# Patient Record
Sex: Female | Born: 1975
Health system: Southern US, Community
[De-identification: ages and names within clinical notes are randomized; demographics above are authoritative.]

## PROBLEM LIST (undated history)

## (undated) DIAGNOSIS — O139 Gestational [pregnancy-induced] hypertension without significant proteinuria, unspecified trimester: Secondary | ICD-10-CM

## (undated) DIAGNOSIS — L570 Actinic keratosis: Secondary | ICD-10-CM

## (undated) DIAGNOSIS — K900 Celiac disease: Secondary | ICD-10-CM

## (undated) HISTORY — DX: Celiac disease: K90.0

## (undated) HISTORY — DX: Actinic keratosis: L57.0

## (undated) HISTORY — PX: HERNIA REPAIR: SHX51

## (undated) HISTORY — DX: Gestational (pregnancy-induced) hypertension without significant proteinuria, unspecified trimester: O13.9

---

## 2009-03-04 HISTORY — PX: REFRACTIVE SURGERY: SHX103

## 2009-03-04 HISTORY — PX: HERNIA REPAIR: SHX51

## 2016-10-22 ENCOUNTER — Encounter: Payer: Self-pay | Admitting: Maternal Newborn

## 2016-10-22 ENCOUNTER — Ambulatory Visit (INDEPENDENT_AMBULATORY_CARE_PROVIDER_SITE_OTHER): Payer: Managed Care, Other (non HMO) | Admitting: Maternal Newborn

## 2016-10-22 VITALS — BP 100/60 | Ht 66.0 in | Wt 152.0 lb

## 2016-10-22 DIAGNOSIS — Z3A09 9 weeks gestation of pregnancy: Secondary | ICD-10-CM

## 2016-10-22 DIAGNOSIS — O0991 Supervision of high risk pregnancy, unspecified, first trimester: Secondary | ICD-10-CM

## 2016-10-22 DIAGNOSIS — K900 Celiac disease: Secondary | ICD-10-CM

## 2016-10-22 DIAGNOSIS — O09529 Supervision of elderly multigravida, unspecified trimester: Secondary | ICD-10-CM | POA: Insufficient documentation

## 2016-10-22 NOTE — Progress Notes (Signed)
10/22/2016    Chief Complaint: Initial prenatal visit  Transfer of Care Patient: yes (moved from [place])  History of Present Illness: [name] is a 41 y.o. G3P0020 at 63 w 5 d gestation based on LMP.  Patient's last menstrual period was 08/15/2016 (exact date). Estimated Date of Delivery: 05/22/2017, with the above CC.   Her periods were: regular periods every 28 days She was using no method when she conceived.  She has Positive signs or symptoms of nausea/vomiting of pregnancy. She has Negative signs or symptoms of miscarriage or preterm labor She identifies Negative Zika risk factors for her and her partner On any different medications around the time she conceived/early pregnancy: No  History of varicella: Yes   ROS: A 12-point review of systems was performed and negative, except as stated in the above HPI.  OBGYN History: As per HPI. OB History  Gravida Para Term Preterm AB Living  3       2    SAB TAB Ectopic Multiple Live Births  1   1        # Outcome Date GA Lbr Len/2nd Weight Sex Delivery Anes PTL Lv  3 Current           2 Ectopic           1 SAB               Any issues with any prior pregnancies: yes. G1 was a SAB and G2 was ectopic in January 2018. Any prior children are healthy, doing well, without any problems or issues: not applicable History of pap smears: Yes. Last pap smear 07/01/16. Abnormal: no   History of STIs: No   Past Medical History: Past Medical History:  Diagnosis Date  . Celiac disease     Past Surgical History: Past Surgical History:  Procedure Laterality Date  . HERNIA REPAIR      Family History:  Family History  Problem Relation Age of Onset  . Cancer Maternal Grandfather   . Cancer Paternal Grandmother    She denies any female cancers, bleeding or blood clotting disorders.  She denies any history of mental retardation, birth defects or genetic disorders in her or the FOB's history  Social History:  Social History    Social History  . Marital status: Married    Spouse name: N/A  . Number of children: N/A  . Years of education: N/A   Occupational History  . Not on file.   Social History Main Topics  . Smoking status: Never Smoker  . Smokeless tobacco: Never Used  . Alcohol use No  . Drug use: No  . Sexual activity: Yes   Other Topics Concern  . Not on file   Social History Narrative  . No narrative on file   Any cats in the household: no   Allergy: Allergies  Allergen Reactions  . Gluten Meal     Current Outpatient Medications: No current outpatient prescriptions on file.   Physical Exam:   BP 100/60   Ht 5\' 6"  (1.676 m)   Wt 152 lb (68.9 kg)   LMP 08/15/2016 (Exact Date)   BMI 24.53 kg/m  Body mass index is 24.53 kg/m. Constitutional: Well nourished, well developed female in no acute distress.  Neck:  Supple, normal appearance, and no thyromegaly  Cardiovascular: S1, S2 normal, no murmur, rub or gallop, regular rate and rhythm Respiratory:  Clear to auscultation bilateral. Normal respiratory effort Abdomen: positive bowel sounds and no masses,  hernias; diffusely non tender to palpation, non distended Breasts: patient declines to have breast exam Neuro/Psych:  Normal mood and affect.  Skin:  Warm and dry.  Lymphatic:  No inguinal lymphadenopathy.   Pelvic exam: is not limited by body habitus Vagina: within normal limits and with no blood in the vault Cervix: without discharge, closed/long/high,  Uterus: enlarged consistent with pregnancy Adnexa:  normal adnexa  Assessment: [name] is a 41 y.o. [id number] with an Estimated Date of Delivery: 05/22/2017, presenting for prenatal care.  [facility] Prenatal Labs   Dating  LMP [ ]  U/S ordered Blood type:     Genetic Screen 1 Screen:    AFP:     Quad:     NIPS:    Antibody:   Anatomic [place]  Rubella:   Varicella:    GTT Early:               Third trimester:  RPR:     Rhogam  HBsAg:     TDaP vaccine                        Flu Shot: HIV:     Baby Food                                GBS:   Contraception  Pap: 07/01/16, normal per patient  CBB     CS/VBAC    Support Person          Plan:   1) Avoid alcoholic beverages. 2) Patient encouraged not to smoke.  3) Discontinue the use of all non-medicinal drugs and chemicals.  4) Take prenatal vitamins daily.  5) Seatbelt use advised 6) Nutrition, food safety (fish, cheese advisories, and high nitrite foods) and exercise discussed. 7) Hospital and practice style delivering at [facility] discussed  8) Patient is asked about travel to areas at risk for the [place] virus, and counseled to avoid travel and exposure to mosquitoes or sexual partners who may have themselves been exposed to the virus. Testing is discussed, and will be ordered as appropriate.  9) Childbirth classes at [facility] advised 10) Genetic Screening, such as with 1st Trimester Screening, cell free fetal DNA, AFP testing, and Ultrasound, as well as with amniocentesis and CVS as appropriate, is discussed with patient. She plans to have genetic testing this pregnancy.  Patient had a prenatal visit in [place] before moving, with labs and an early ultrasound around 5 weeks. Authorization was given for records to be sent. She desires to wait for lab testing until [place] to see if her records arrive.  Ultrasound ordered for dates and viability, pt to schedule for tomorrow or as soon as she can return.  Samples given of [place] for evening nausea, and pt advised to call for script if desired.  Problem list reviewed and updated.  Avel Sensor, CNM [facility], [facility] 10/22/2016  4:26 PM

## 2016-10-25 ENCOUNTER — Other Ambulatory Visit: Payer: Managed Care, Other (non HMO)

## 2016-10-25 ENCOUNTER — Encounter: Payer: Managed Care, Other (non HMO) | Admitting: Maternal Newborn

## 2016-10-25 ENCOUNTER — Ambulatory Visit (INDEPENDENT_AMBULATORY_CARE_PROVIDER_SITE_OTHER): Payer: Managed Care, Other (non HMO)

## 2016-10-25 ENCOUNTER — Ambulatory Visit (INDEPENDENT_AMBULATORY_CARE_PROVIDER_SITE_OTHER): Payer: Managed Care, Other (non HMO) | Admitting: Maternal Newborn

## 2016-10-25 VITALS — BP 100/80 | Wt 154.0 lb

## 2016-10-25 DIAGNOSIS — O0991 Supervision of high risk pregnancy, unspecified, first trimester: Secondary | ICD-10-CM

## 2016-10-25 DIAGNOSIS — O09529 Supervision of elderly multigravida, unspecified trimester: Secondary | ICD-10-CM

## 2016-10-25 DIAGNOSIS — Z362 Encounter for other antenatal screening follow-up: Secondary | ICD-10-CM | POA: Diagnosis not present

## 2016-10-25 DIAGNOSIS — K900 Celiac disease: Secondary | ICD-10-CM

## 2016-10-25 NOTE — Progress Notes (Signed)
    Routine Prenatal Care Visit  Subjective  Kristine Arroyo is a 41 y.o. G3P0020 at [redacted]w[redacted]d being seen today for ongoing prenatal care.  She is currently monitored for the following issues for this high-risk pregnancy and has Supervision of high risk pregnancy, antepartum, first trimester; Celiac disease; and Antepartum multigravida of advanced maternal age on her problem list.  ----------------------------------------------------------------------------------- Patient reports no complaints.    Vag. Bleeding: None.   Denies leaking of fluid.  ----------------------------------------------------------------------------------- The following portions of the patient's history were reviewed and updated as appropriate: allergies, current medications, past family history, past medical history, past social history, past surgical history and problem list. Problem list updated.   Objective  Blood pressure 100/80, weight 154 lb (69.9 kg), last menstrual period 08/15/2016. Pregravid weight 150 lb (68 kg) Total Weight Gain 4 lb (1.814 kg) Urinalysis: Urine Protein: Negative Urine Glucose: Negative  Fetal Status:  Fetal Heart Rate (bpm): 171 (seen on [place])         General:  Alert, oriented and cooperative. Patient is in no acute distress.  Skin: Skin is warm and dry. No rash noted.   Cardiovascular: Normal heart rate noted  Respiratory: Normal respiratory effort, no problems with respiration noted  Abdomen: Soft, gravid, appropriate for gestational age. Pain/Pressure: Absent     Pelvic:  Cervical exam deferred        Extremities: Normal range of motion.     ental Status: Normal mood and affect. Normal behavior. Normal judgment and thought content.   Ultrasound today shows singleton IUP with cardiac activity.  Assessment   41 y.o. G3P0020 at [redacted]w[redacted]d by  05/22/2017, by Last Menstrual Period presenting for routine prenatal visit  Plan   pregnancy 3 Problems (from 08/15/16 to present)    Problem Noted  Resolved   Supervision of high risk pregnancy, antepartum, first trimester 10/22/2016 by Rexene Agent, CNM No   Overview Signed 10/22/2016  4:57 PM by Rexene Agent, [place] Prenatal Labs  Dating LMP=10 w [place] Blood type:     Genetic Screen 1 Screen:    AFP:     Quad:     NIPS: Antibody:   Anatomic [place]  Rubella:   Varicella:    GTT Early:               Third trimester:  RPR:     Rhogam  HBsAg:     TDaP vaccine                       Flu Shot: HIV:     Baby Food                                GBS:   Contraception  Pap:  CBB     CS/VBAC    Support Person              Antepartum multigravida of advanced maternal age 90/21/2018 by Rexene Agent, CNM No      MaterniT 21 ordered as patient desires genetic testing.  Preterm labor symptoms and general obstetric precautions including but not limited to vaginal bleeding, contractions, leaking of fluid and fetal movement were reviewed in detail with the patient. Please refer to After Visit Summary for other counseling recommendations.   Return in about 4 weeks (around 11/22/2016) for ROB.   Avel Sensor, CNM

## 2016-10-27 ENCOUNTER — Emergency Department: Payer: 59

## 2016-10-27 ENCOUNTER — Emergency Department
Admission: EM | Admit: 2016-10-27 | Discharge: 2016-10-27 | Disposition: A | Discharge status: Home / Self Care | Payer: 59 | Attending: Emergency Medicine | Admitting: Emergency Medicine

## 2016-10-27 DIAGNOSIS — N83201 Unspecified ovarian cyst, right side: Secondary | ICD-10-CM

## 2016-10-27 DIAGNOSIS — R109 Unspecified abdominal pain: Secondary | ICD-10-CM

## 2016-10-27 DIAGNOSIS — Z3A11 11 weeks gestation of pregnancy: Secondary | ICD-10-CM | POA: Diagnosis not present

## 2016-10-27 DIAGNOSIS — K900 Celiac disease: Secondary | ICD-10-CM | POA: Insufficient documentation

## 2016-10-27 DIAGNOSIS — O0991 Supervision of high risk pregnancy, unspecified, first trimester: Secondary | ICD-10-CM | POA: Insufficient documentation

## 2016-10-27 DIAGNOSIS — O26891 Other specified pregnancy related conditions, first trimester: Secondary | ICD-10-CM

## 2016-10-27 DIAGNOSIS — O3481 Maternal care for other abnormalities of pelvic organs, first trimester: Secondary | ICD-10-CM | POA: Insufficient documentation

## 2016-10-27 DIAGNOSIS — R1031 Right lower quadrant pain: Secondary | ICD-10-CM | POA: Diagnosis present

## 2016-10-27 LAB — URINALYSIS, COMPLETE (UACMP) WITH MICROSCOPIC
BACTERIA UA: NONE SEEN
Bilirubin Urine: NEGATIVE
Glucose, UA: NEGATIVE mg/dL
Hgb urine dipstick: NEGATIVE
KETONES UR: 20 mg/dL — AB
Leukocytes, UA: NEGATIVE
NITRITE: NEGATIVE
PROTEIN: NEGATIVE mg/dL
Specific Gravity, Urine: 1.019 (ref 1.005–1.030)
pH: 7 (ref 5.0–8.0)

## 2016-10-27 LAB — CBC
HEMATOCRIT: 39.3 % (ref 35.0–47.0)
Hemoglobin: 13.8 g/dL (ref 12.0–16.0)
MCH: 31.2 pg (ref 26.0–34.0)
MCHC: 35.2 g/dL (ref 32.0–36.0)
MCV: 88.6 fL (ref 80.0–100.0)
Platelets: 174 10*3/uL (ref 150–440)
RBC: 4.43 MIL/uL (ref 3.80–5.20)
RDW: 12.5 % (ref 11.5–14.5)
WBC: 8.4 10*3/uL (ref 3.6–11.0)

## 2016-10-27 LAB — WET PREP, GENITAL
Clue Cells Wet Prep HPF POC: NONE SEEN
SPERM: NONE SEEN
TRICH WET PREP: NONE SEEN
Yeast Wet Prep HPF POC: NONE SEEN

## 2016-10-27 LAB — CHLAMYDIA/NGC RT PCR (ARMC ONLY)
CHLAMYDIA TR: NOT DETECTED
N GONORRHOEAE: NOT DETECTED

## 2016-10-27 LAB — COMPREHENSIVE METABOLIC PANEL
ALBUMIN: 4.6 g/dL (ref 3.5–5.0)
ALK PHOS: 48 U/L (ref 38–126)
ALT: 11 U/L — ABNORMAL LOW (ref 14–54)
AST: 22 U/L (ref 15–41)
Anion gap: 9 (ref 5–15)
BILIRUBIN TOTAL: 0.6 mg/dL (ref 0.3–1.2)
BUN: 7 mg/dL (ref 6–20)
CO2: 20 mmol/L — AB (ref 22–32)
Calcium: 9.4 mg/dL (ref 8.9–10.3)
Chloride: 106 mmol/L (ref 101–111)
Creatinine, Ser: 0.54 mg/dL (ref 0.44–1.00)
GFR calc Af Amer: >60 mL/min (ref >60)
GFR calc non Af Amer: >60 mL/min (ref >60)
GLUCOSE: 86 mg/dL (ref 65–99)
POTASSIUM: 4.1 mmol/L (ref 3.5–5.1)
Sodium: 135 mmol/L (ref 135–145)
TOTAL PROTEIN: 7.9 g/dL (ref 6.5–8.1)

## 2016-10-27 LAB — LIPASE, BLOOD: Lipase: 24 U/L (ref 11–51)

## 2016-10-27 LAB — HCG, QUANTITATIVE, PREGNANCY: HCG, BETA CHAIN, QUANT, S: 58107 m[IU]/mL — AB (ref <5)

## 2016-10-27 MED ORDER — HYDROCODONE-ACETAMINOPHEN 5-325 MG PO TABS
1.0000 | ORAL_TABLET | Freq: Once | ORAL | Status: AC
  Administered 2016-10-27: 1 via ORAL
  Filled 2016-10-27: qty 1

## 2016-10-27 MED ORDER — SODIUM CHLORIDE 0.9 % IV BOLUS (SEPSIS)
1000.0000 mL | Freq: Once | INTRAVENOUS | Status: AC
  Administered 2016-10-27: 1000 mL via INTRAVENOUS

## 2016-10-27 MED ORDER — MORPHINE SULFATE (PF) 4 MG/ML IV SOLN
4.0000 mg | Freq: Once | INTRAVENOUS | Status: AC
  Administered 2016-10-27: 4 mg via INTRAVENOUS
  Filled 2016-10-27: qty 1

## 2016-10-27 MED ORDER — HYDROCODONE-ACETAMINOPHEN 5-325 MG PO TABS: 1.0000 | ORAL_TABLET | Freq: Four times a day (QID) | ORAL | 0 refills | Status: DC | PRN

## 2016-10-27 NOTE — ED Notes (Signed)
Pt left for ultrasound.

## 2016-10-27 NOTE — ED Notes (Signed)
Patient was given ginger ale for a PO challenge. Patient declined crackers.

## 2016-10-27 NOTE — ED Triage Notes (Signed)
Pt states she started having right lower abdominal pain this morning. Pt is [redacted] weeks pregnant and had [place] on Friday. Pt states the pain radiates towards her back.  Denies any vaginal bleeding and states she is having vaginal discharge that is white and creamy.

## 2016-10-27 NOTE — Discharge Instructions (Signed)
If you have increased pain, fever, vomiting, or you feel worse in any way please return to the emergency department. Please see your OB tomorrow without fail.

## 2016-10-27 NOTE — ED Notes (Signed)
Pt actively vomiting in triage 200cc

## 2016-10-27 NOTE — ED Notes (Signed)
Pelvic exam performed. Patient tolerated procedure well.

## 2016-10-27 NOTE — ED Provider Notes (Addendum)
[facility] Emergency Department Provider Note  ____________________________________________   I have reviewed the triage vital signs and the nursing notes.   HISTORY  Chief Complaint Abdominal Pain    HPI Kristine Arroyo is a 41 y.o. female  Presents today c/o sudden onset r sided lower abd pain. Pt is [redacted] weeks pregnant. She is g3P0. She had a tab at 18 and ectopic treated with mtx late last year [place]. She is not taking profertility agents. She had a remote hernia repair years ago on the right, otw no surgical hx.  Has a known iup from US performed at [facility] 2 days ago. Not known if she has a cyst. No hx of stones.  pain is sharp and in the r lower abd. Sudden onset a few hours pt arrival while pt was at rest. Nothing makes it better nothing makes it worse. No fever. Had some nausea. Is afraid of needles and vomited x 1 when the needle was placed but did feel that was because of anxiety about the iv.  No change in stooling . No fever.  Nothing makes it better or worse. Seems to radiate towards the right flank. No dysuria or frequency. Has normal vaginal d/c withno change.  Has one partner.      Past Medical History:  Diagnosis Date  . Celiac disease     Patient Active Problem List   Diagnosis Date Noted  . Supervision of high risk pregnancy, antepartum, first trimester 10/22/2016  . Celiac disease 10/22/2016  . Antepartum multigravida of advanced maternal age 35/21/2018    Past Surgical History:  Procedure Laterality Date  . HERNIA REPAIR      Prior to Admission medications   Not on File    Allergies Gluten meal  Family History  Problem Relation Age of Onset  . Cancer Maternal Grandfather   . Cancer Paternal Grandmother     Social History Social History  Substance Use Topics  . Smoking status: Never Smoker  . Smokeless tobacco: Never Used  . Alcohol use No    Review of Systems Constitutional: No fever/chills Eyes: No  visual changes. ENT: No sore throat. No stiff neck no neck pain Cardiovascular: Denies chest pain. Respiratory: Denies shortness of breath. Gastrointestinal:   + vomiting.  No diarrhea.  No constipation. Genitourinary: Negative for dysuria. Musculoskeletal: Negative lower extremity swelling Skin: Negative for rash. Neurological: Negative for severe headaches, focal weakness or numbness.   ____________________________________________   PHYSICAL EXAM:  VITAL SIGNS: ED Triage Vitals  Enc Vitals Group     BP 10/27/16 1045 128/73     Pulse Rate 10/27/16 1045 74     Resp 10/27/16 1045 20     Temp 10/27/16 1045 98.3 F (36.8 C)     Temp Source 10/27/16 1045 Oral     SpO2 10/27/16 1045 100 %     Weight 10/27/16 1045 154 lb (69.9 kg)     Height 10/27/16 1045 5\' 6"  (1.676 m)     Head Circumference --      Peak Flow --      Pain Score 10/27/16 1044 8     Pain Loc --      Pain Edu? --      Excl. in [place]? --     Constitutional: Alert and oriented. Well appearing and in no acute distress. Eyes: Conjunctivae are normal Head: Atraumatic HEENT: No congestion/rhinnorhea. Mucous membranes are moist.  Oropharynx non-erythematous Neck:   Nontender with no  meningismus, no masses, no stridor Cardiovascular: Normal rate, regular rhythm. Grossly normal heart sounds.  Good peripheral circulation. Respiratory: Normal respiratory effort.  No retractions. Lungs CTAB. Abdominal: Soft and + minimal tenderness to palpation in the medial R abd. No distention. No guarding no rebound Back:  There is no focal tenderness or step off.  there is no midline tenderness there are no lesions noted. there is no CVA tenderness GU: deferred for [place] Musculoskeletal: No lower extremity tenderness, no upper extremity tenderness. No joint effusions, no DVT signs strong distal pulses no edema Neurologic:  Normal speech and language. No gross focal neurologic deficits are appreciated.  Skin:  Skin is warm, dry and intact.  No rash noted. Psychiatric: Mood and affect are normal. Speech and behavior are normal.  ____________________________________________   LABS (all labs ordered are listed, but only abnormal results are displayed)  Labs Reviewed  COMPREHENSIVE METABOLIC PANEL - Abnormal; Notable for the following:       Result Value   CO2 20 (*)    ALT 11 (*)    All other components within normal limits  URINALYSIS, COMPLETE (UACMP) WITH MICROSCOPIC - Abnormal; Notable for the following:    Color, Urine YELLOW (*)    APPearance CLOUDY (*)    Ketones, ur 20 (*)    Squamous Epithelial / LPF 0-5 (*)    All other components within normal limits  LIPASE, BLOOD  CBC  HCG, QUANTITATIVE, PREGNANCY   ____________________________________________  EKG  I personally interpreted any EKGs ordered by me or triage ____________________________________________  RADIOLOGY  I reviewed any imaging ordered by me or triage that were performed during my shift and, if possible, patient and/or family made aware of any abnormal findings. ____________________________________________   PROCEDURES  Procedure(s) performed: None  Procedures  Critical Care performed: None  ____________________________________________   INITIAL IMPRESSION / ASSESSMENT AND PLAN / ED COURSE  Pertinent labs & imaging results that were available during my care of the patient were reviewed by me and considered in my medical decision making (see chart for details).  Pt here with benign abd and sudden onset r adnexal area pain in the context of pregnancy. No blood in her urine. Hd stable  Patient in no acute distress, has ongoing pain which is reproducible,differential does include ovarian cyst versus torsion versus kidney stone,  ----------------------------------------- 1:47 PM on 10/27/2016 -----------------------------------------  Pain well controlled at this time,  Pelvic exam: Female nurse chaperone present, no external  lesions noted, physiologic vaginal discharge noted with no purulent discharge, no cervical motion tenderness, + slight adnexal tenderness or mass, there is no significant uterine tenderness or mass. No vaginal bleeding  ----------------------------------------- 1:48 PM on 10/27/2016 -----------------------------------------  W/u very reassuring here. Again, low suspicion for appy. No peritoneal signs, no fever, sudden onset pain.  Has + corpeus luteal cyst which ispossibly because of her pain but no evidence of torsion. I discussed with Dr. Kenton Kingfisher ofOB/GYN, who feel that the patient likelydoes not have appendicitis either and does not think MRI is indicated. He thinks close outpatient follow-up in a few days with pain control for likely cystic or round ligament pain would be a reasonable plan. Nothing to suggest ectopic pregnancy, nothing to suggest torsion for heterotropic preg. Nothing  to suggest appendicitis clinically, we will see if she can tolerate by mouth, patien very reassured findings. Has minimal discomfort at this time. She would like a vicodin in case the pain comes back.  Will try po.   Return precautions  and f/u given and understood. Pt does have 'celiac dz' and cannot have crackers so we will try gingerale to start.   ----------------------------------------- 2:39 PM on 10/27/2016 -----------------------------------------  tol po here only complaint is hunger will d/c.    ____________________________________________   FINAL CLINICAL IMPRESSION(S) / ED DIAGNOSES  Final diagnoses:  Abdominal pain      This chart was dictated using voice recognition software.  Despite best efforts to proofread,  errors can occur which can change meaning.      Schuyler Amor, MD 10/27/16 1404    Schuyler Amor, MD 10/27/16 1408    Schuyler Amor, MD 10/27/16 [phone]

## 2016-10-28 ENCOUNTER — Encounter: Payer: Self-pay | Admitting: Advanced Practice Midwife

## 2016-10-28 ENCOUNTER — Telehealth: Payer: Self-pay | Admitting: Obstetrics & Gynecology

## 2016-10-28 NOTE — Telephone Encounter (Signed)
Pt is calling needing to be seen. Pt states she is [redacted] weeks Pregnant and having abdominal pain. Pt states she went to [facility] ER yesterday. Please review report and advise work In.,

## 2016-10-28 NOTE — Telephone Encounter (Signed)
Pt given reassuring findings in ER yesterday, if she has started bleeding or severe increase is pain from yesterday please check with MD for appt. Otherwise can wait until next scheduled appt.

## 2016-10-30 ENCOUNTER — Encounter: Payer: Self-pay | Admitting: Maternal Newborn

## 2016-10-30 LAB — MATERNIT 21 PLUS CORE, BLOOD
CHROMOSOME 13: NEGATIVE
CHROMOSOME 21: NEGATIVE
Chromosome 18: NEGATIVE
Y CHROMOSOME: NOT DETECTED

## 2016-10-30 NOTE — Telephone Encounter (Signed)
Called and left voicemail for patient to call back to schedule °

## 2016-11-01 ENCOUNTER — Encounter: Payer: Self-pay | Admitting: Maternal Newborn

## 2016-11-19 ENCOUNTER — Encounter: Payer: Self-pay | Admitting: Maternal Newborn

## 2016-11-22 ENCOUNTER — Encounter: Payer: Managed Care, Other (non HMO) | Admitting: Maternal Newborn

## 2016-11-29 ENCOUNTER — Ambulatory Visit (INDEPENDENT_AMBULATORY_CARE_PROVIDER_SITE_OTHER): Payer: 59 | Admitting: Maternal Newborn

## 2016-11-29 VITALS — BP 120/80 | Wt 151.0 lb

## 2016-11-29 DIAGNOSIS — Z3A15 15 weeks gestation of pregnancy: Secondary | ICD-10-CM

## 2016-11-29 DIAGNOSIS — O09522 Supervision of elderly multigravida, second trimester: Secondary | ICD-10-CM

## 2016-11-29 DIAGNOSIS — O0991 Supervision of high risk pregnancy, unspecified, first trimester: Secondary | ICD-10-CM

## 2016-11-29 NOTE — Progress Notes (Signed)
    Routine Prenatal Care Visit  Subjective  Kristine Arroyo is a 41 y.o. G3P0020 at [redacted]w[redacted]d being seen today for ongoing prenatal care.  She is currently monitored for the following issues for this high-risk pregnancy and has Supervision of high risk pregnancy, antepartum, first trimester; Celiac disease; and Antepartum multigravida of advanced maternal age on her problem list.  ----------------------------------------------------------------------------------- Patient reports no complaints.    .  .   . Denies leaking of fluid.  ----------------------------------------------------------------------------------- The following portions of the patient's history were reviewed and updated as appropriate: allergies, current medications, past family history, past medical history, past social history, past surgical history and problem list. Problem list updated.   Objective  Blood pressure 120/80, weight 151 lb (68.5 kg), last menstrual period 08/15/2016. Pregravid weight 150 lb (68 kg) Total Weight Gain 1 lb (0.454 kg) Urinalysis: Urine Protein: Negative Urine Glucose: Negative  Fetal Status:           General:  Alert, oriented and cooperative. Patient is in no acute distress.  Skin: Skin is warm and dry. No rash noted.   Cardiovascular: Normal heart rate noted  Respiratory: Normal respiratory effort, no problems with respiration noted  Abdomen: Soft, gravid, appropriate for gestational age.       Pelvic:  Cervical exam deferred        Extremities: Normal range of motion.     Mental Status: Normal mood and affect. Normal behavior. Normal judgment and thought content.     Assessment   41 y.o. G3P0020 at [redacted]w[redacted]d by  05/22/2017, by Last Menstrual Period presenting for routine prenatal visit  Plan   pregnancy 3 Problems (from 08/15/16 to present)    Problem Noted Resolved   Supervision of high risk pregnancy, antepartum, first trimester 10/22/2016 by Rexene Agent, CNM No   Overview Signed  10/22/2016  4:57 PM by Rexene Agent, [place] Prenatal Labs  Dating LMP Blood type: O Pos    Genetic Screen Materniti21 - Neg, It's a girl! Antibody:   Anatomic [place]  Rubella: Immune  Varicella:    GTT Early:               Third trimester:  RPR:  Non-reactive  Rhogam  HBsAg: Negative   TDaP vaccine                       Flu Shot: HIV:  Non-reactive  Baby Food                                GBS:   Contraception  Pap:  CBB     CS/VBAC    Support Person              Antepartum multigravida of advanced maternal age 53/21/2018 by Rexene Agent, CNM No    Nausea has greatly improved; able to eat more often and larger variety of foods. Discussed antenatal testing recommended for pregnancy at age 20+ Anatomy US next visit.  Preterm labor symptoms and general obstetric precautions including but not limited to vaginal bleeding, contractions, leaking of fluid and fetal movement were reviewed in detail with the patient.  Return in about 4 weeks (around 12/27/2016) for [place] following [place].  Avel Sensor, CNM 11/29/2016  4:49 PM

## 2016-12-16 ENCOUNTER — Encounter: Payer: Self-pay | Admitting: Maternal Newborn

## 2016-12-17 ENCOUNTER — Other Ambulatory Visit: Payer: Self-pay | Admitting: Maternal Newborn

## 2016-12-17 DIAGNOSIS — M545 Low back pain, unspecified: Secondary | ICD-10-CM | POA: Insufficient documentation

## 2016-12-17 MED ORDER — CYCLOBENZAPRINE HCL 10 MG PO TABS: 10.0000 mg | ORAL_TABLET | Freq: Three times a day (TID) | ORAL | 2 refills | Status: DC | PRN

## 2016-12-17 NOTE — Progress Notes (Signed)
Sent Rx for Flexeril for worsening lower back pain and muscle spasms.  Avel Sensor, CNM 12/17/2016  5:15 PM

## 2016-12-27 ENCOUNTER — Ambulatory Visit (INDEPENDENT_AMBULATORY_CARE_PROVIDER_SITE_OTHER): Payer: 59 | Admitting: Obstetrics & Gynecology

## 2016-12-27 ENCOUNTER — Ambulatory Visit (INDEPENDENT_AMBULATORY_CARE_PROVIDER_SITE_OTHER): Payer: 59

## 2016-12-27 VITALS — BP 120/70 | Wt 154.0 lb

## 2016-12-27 DIAGNOSIS — O09522 Supervision of elderly multigravida, second trimester: Secondary | ICD-10-CM

## 2016-12-27 DIAGNOSIS — O09529 Supervision of elderly multigravida, unspecified trimester: Secondary | ICD-10-CM

## 2016-12-27 DIAGNOSIS — Z3A19 19 weeks gestation of pregnancy: Secondary | ICD-10-CM

## 2016-12-27 DIAGNOSIS — O0991 Supervision of high risk pregnancy, unspecified, first trimester: Secondary | ICD-10-CM

## 2016-12-27 NOTE — Patient Instructions (Signed)

## 2016-12-27 NOTE — Progress Notes (Signed)
Review of ULTRASOUND.    I have personally reviewed images and report of recent ultrasound done at [facility].    Plan of management to be discussed with patient.  PNV, Breast feeding plans.  [name], MD, [facility], [facility] 12/27/2016  4:54 PM

## 2017-01-01 ENCOUNTER — Encounter: Payer: Self-pay | Admitting: Advanced Practice Midwife

## 2017-01-03 ENCOUNTER — Other Ambulatory Visit: Payer: Self-pay | Admitting: Maternal Newborn

## 2017-01-03 DIAGNOSIS — O09522 Supervision of elderly multigravida, second trimester: Secondary | ICD-10-CM

## 2017-01-27 ENCOUNTER — Ambulatory Visit (INDEPENDENT_AMBULATORY_CARE_PROVIDER_SITE_OTHER): Payer: 59

## 2017-01-27 ENCOUNTER — Ambulatory Visit (INDEPENDENT_AMBULATORY_CARE_PROVIDER_SITE_OTHER): Payer: 59 | Admitting: Advanced Practice Midwife

## 2017-01-27 ENCOUNTER — Encounter: Payer: Self-pay | Admitting: Advanced Practice Midwife

## 2017-01-27 VITALS — BP 108/60 | Wt 158.0 lb

## 2017-01-27 DIAGNOSIS — O0991 Supervision of high risk pregnancy, unspecified, first trimester: Secondary | ICD-10-CM

## 2017-01-27 DIAGNOSIS — O09522 Supervision of elderly multigravida, second trimester: Secondary | ICD-10-CM | POA: Diagnosis not present

## 2017-01-27 DIAGNOSIS — Z3A23 23 weeks gestation of pregnancy: Secondary | ICD-10-CM

## 2017-01-27 DIAGNOSIS — Z362 Encounter for other antenatal screening follow-up: Secondary | ICD-10-CM | POA: Diagnosis not present

## 2017-01-27 DIAGNOSIS — Z131 Encounter for screening for diabetes mellitus: Secondary | ICD-10-CM

## 2017-01-27 DIAGNOSIS — Z130 Encounter for screening for diseases of the blood and blood-forming organs and certain disorders involving the immune mechanism: Secondary | ICD-10-CM

## 2017-01-27 DIAGNOSIS — O09529 Supervision of elderly multigravida, unspecified trimester: Secondary | ICD-10-CM

## 2017-01-27 DIAGNOSIS — Z113 Encounter for screening for infections with a predominantly sexual mode of transmission: Secondary | ICD-10-CM

## 2017-01-27 NOTE — Progress Notes (Signed)
  Routine Prenatal Care Visit  Subjective  Kristine Arroyo is a 41 y.o. G3P0020 at [redacted]w[redacted]d being seen today for ongoing prenatal care.  She is currently monitored for the following issues for this high-risk pregnancy and has Supervision of high risk pregnancy, antepartum, first trimester; Celiac disease; Antepartum multigravida of advanced maternal age; and Lumbago on their problem list.  ----------------------------------------------------------------------------------- Patient reports no complaints.    Vag. Bleeding: None.  Denies contractions. Denies leaking of fluid.  ----------------------------------------------------------------------------------- The following portions of the patient's history were reviewed and updated as appropriate: allergies, current medications, past family history, past medical history, past social history, past surgical history and problem list. Problem list updated.   Objective  Blood pressure 108/60, weight 158 lb (71.7 kg), last menstrual period 08/15/2016. Pregravid weight 150 lb (68 kg) Total Weight Gain 8 lb (3.629 kg) Urinalysis: Urine Protein: Negative Urine Glucose: Negative  Fetal Status: positive fetal movement  General:  Alert, oriented and cooperative. Patient is in no acute distress.  Skin: Skin is warm and dry. No rash noted.   Cardiovascular: Normal heart rate noted  Respiratory: Normal respiratory effort, no problems with respiration noted  Abdomen: Soft, gravid, appropriate for gestational age. Pain/Pressure: Absent     Pelvic:  Cervical exam deferred        Extremities: Normal range of motion.     Mental Status: Normal mood and affect. Normal behavior. Normal judgment and thought content.   Assessment   41 y.o. G3P0020 at [redacted]w[redacted]d by  05/22/2017, by Last Menstrual Period presenting for routine prenatal visit  Plan   pregnancy 3 Problems (from 08/15/16 to present)    Problem Noted Resolved   Lumbago 12/17/2016 by Rexene Agent, CNM No     Supervision of high risk pregnancy, antepartum, first trimester 10/22/2016 by Rexene Agent, CNM No   Overview Addendum 11/29/2016  4:56 PM by Rexene Agent, [place] Prenatal Labs  Dating LMP Blood type:   O Pos  Genetic Screen Materniti21 - Neg, It's a girl! Antibody:   Anatomic [place]  Rubella:  Immune  Varicella:    GTT Early:               Third trimester:  RPR:   NR  Rhogam  HBsAg:   Neg  TDaP vaccine                       Flu Shot: HIV:   NR  Baby Food                                GBS:   Contraception  Pap:  CBB     CS/VBAC NA   Support Person              Antepartum multigravida of advanced maternal age 34/21/2018 by Rexene Agent, CNM No      Discussion of use of Evening Primrose oil- use at 37 weeks, raspberry leaf tea- may start drinking now. Continue with chiropractic, acupuncture as needed. Preterm labor symptoms and general obstetric precautions including but not limited to vaginal bleeding, contractions, leaking of fluid and fetal movement were reviewed in detail with the patient.   Return in about 4 weeks (around 02/24/2017) for f/u growth/afi, 28 wk labs, rob.  Rod Can, CNM  01/27/2017 10:10 AM

## 2017-02-27 ENCOUNTER — Ambulatory Visit (INDEPENDENT_AMBULATORY_CARE_PROVIDER_SITE_OTHER): Payer: 59

## 2017-02-27 ENCOUNTER — Ambulatory Visit (INDEPENDENT_AMBULATORY_CARE_PROVIDER_SITE_OTHER): Payer: 59 | Admitting: Obstetrics and Gynecology

## 2017-02-27 ENCOUNTER — Other Ambulatory Visit: Payer: 59

## 2017-02-27 VITALS — BP 100/56 | Wt 162.0 lb

## 2017-02-27 DIAGNOSIS — O09529 Supervision of elderly multigravida, unspecified trimester: Secondary | ICD-10-CM

## 2017-02-27 DIAGNOSIS — O0991 Supervision of high risk pregnancy, unspecified, first trimester: Secondary | ICD-10-CM | POA: Diagnosis not present

## 2017-02-27 DIAGNOSIS — Z130 Encounter for screening for diseases of the blood and blood-forming organs and certain disorders involving the immune mechanism: Secondary | ICD-10-CM

## 2017-02-27 DIAGNOSIS — Z131 Encounter for screening for diabetes mellitus: Secondary | ICD-10-CM

## 2017-02-27 DIAGNOSIS — Z362 Encounter for other antenatal screening follow-up: Secondary | ICD-10-CM | POA: Diagnosis not present

## 2017-02-27 DIAGNOSIS — Z3A28 28 weeks gestation of pregnancy: Secondary | ICD-10-CM

## 2017-02-27 DIAGNOSIS — Z113 Encounter for screening for infections with a predominantly sexual mode of transmission: Secondary | ICD-10-CM

## 2017-02-27 NOTE — Progress Notes (Signed)
ROB Growth U/S and AFI 28 week labs

## 2017-02-27 NOTE — Progress Notes (Signed)
    Routine Prenatal Care Visit  Subjective  Kristine Arroyo is a 41 y.o. G3P0020 at [redacted]w[redacted]d being seen today for ongoing prenatal care.  She is currently monitored for the following issues for this high-risk pregnancy and has Supervision of high risk pregnancy, antepartum, first trimester; Celiac disease; Antepartum multigravida of advanced maternal age; and Lumbago on their problem list.  ----------------------------------------------------------------------------------- Patient reports no complaints.   Contractions: Not present. Vag. Bleeding: None.  Movement: Present. Denies leaking of fluid.  ----------------------------------------------------------------------------------- The following portions of the patient's history were reviewed and updated as appropriate: allergies, current medications, past family history, past medical history, past social history, past surgical history and problem list. Problem list updated.   Objective  Blood pressure (!) 100/56, weight 162 lb (73.5 kg), last menstrual period 08/15/2016. Pregravid weight 150 lb (68 kg) Total Weight Gain 12 lb (5.443 kg) Urinalysis: Urine Protein: Negative Urine Glucose: Negative  Fetal Status: Fetal Heart Rate (bpm): 140 Fundal Height: 28 cm Movement: Present     General:  Alert, oriented and cooperative. Patient is in no acute distress.  Skin: Skin is warm and dry. No rash noted.   Cardiovascular: Normal heart rate noted  Respiratory: Normal respiratory effort, no problems with respiration noted  Abdomen: Soft, gravid, appropriate for gestational age. Pain/Pressure: Absent     Pelvic:  Cervical exam deferred        Extremities: Normal range of motion.     ental Status: Normal mood and affect. Normal behavior. Normal judgment and thought content.     Assessment   41 y.o. G3P0020 at [redacted]w[redacted]d by  05/22/2017, by Last Menstrual Period presenting for routine prenatal visit  Plan   pregnancy 3 Problems (from 08/15/16 to  present)    Problem Noted Resolved   Lumbago 12/17/2016 by Rexene Agent, CNM No   Supervision of high risk pregnancy, antepartum, first trimester 10/22/2016 by Rexene Agent, CNM No   Overview Addendum 11/29/2016  4:56 PM by Rexene Agent, [place] Prenatal Labs  Dating LMP Blood type:   O Pos  Genetic Screen Materniti21 - Neg, It's a girl! Antibody:   Anatomic [place]  Rubella:  Immune  Varicella:    GTT Early:               Third trimester:  RPR:   NR  Rhogam  HBsAg:   Neg  TDaP vaccine                       Flu Shot: HIV:   NR  Baby Food                                GBS:   Contraception  Pap:  CBB     CS/VBAC    Support Person              Antepartum multigravida of advanced maternal age 76/21/2018 by Rexene Agent, CNM No       Preterm labor symptoms and general obstetric precautions including but not limited to vaginal bleeding, contractions, leaking of fluid and fetal movement were reviewed in detail with the patient. Please refer to After Visit Summary for other counseling recommendations.  - growth scan 1090g or 2lbs 6oz c/w 43.6%ile AFI 15.96cm - 28 week labs today - Declines influenza Return in about 2 weeks (around 03/13/2017).

## 2017-02-28 ENCOUNTER — Telehealth: Payer: Self-pay

## 2017-02-28 LAB — 28 WEEK RH+PANEL
BASOS: 0 %
Basophils Absolute: 0 10*3/uL (ref 0.0–0.2)
EOS (ABSOLUTE): 0.1 10*3/uL (ref 0.0–0.4)
EOS: 1 %
GESTATIONAL DIABETES SCREEN: 88 mg/dL (ref 65–139)
HEMATOCRIT: 34.9 % (ref 34.0–46.6)
HEMOGLOBIN: 11.9 g/dL (ref 11.1–15.9)
HIV Screen 4th Generation wRfx: NONREACTIVE
Immature Grans (Abs): 0.1 10*3/uL (ref 0.0–0.1)
Immature Granulocytes: 1 %
LYMPHS ABS: 1.9 10*3/uL (ref 0.7–3.1)
Lymphs: 18 %
MCH: 30.3 pg (ref 26.6–33.0)
MCHC: 34.1 g/dL (ref 31.5–35.7)
MCV: 89 fL (ref 79–97)
MONOCYTES: 4 %
Monocytes Absolute: 0.4 10*3/uL (ref 0.1–0.9)
NEUTROS ABS: 8 10*3/uL — AB (ref 1.4–7.0)
Neutrophils: 76 %
Platelets: 226 10*3/uL (ref 150–379)
RBC: 3.93 x10E6/uL (ref 3.77–5.28)
RDW: 13.4 % (ref 12.3–15.4)
RPR: NONREACTIVE
WBC: 10.4 10*3/uL (ref 3.4–10.8)

## 2017-02-28 LAB — VARICELLA ZOSTER ANTIBODY, IGG: VARICELLA: 858 {index} (ref >165)

## 2017-02-28 NOTE — Telephone Encounter (Signed)
Pt calling about GTT results. CB# [phone]

## 2017-02-28 NOTE — Telephone Encounter (Signed)
Spoke with patient and gave her results.

## 2017-03-14 ENCOUNTER — Ambulatory Visit (INDEPENDENT_AMBULATORY_CARE_PROVIDER_SITE_OTHER): Payer: 59 | Admitting: Obstetrics & Gynecology

## 2017-03-14 VITALS — BP 120/80 | Wt 166.0 lb

## 2017-03-14 DIAGNOSIS — O0991 Supervision of high risk pregnancy, unspecified, first trimester: Secondary | ICD-10-CM

## 2017-03-14 DIAGNOSIS — Z3A30 30 weeks gestation of pregnancy: Secondary | ICD-10-CM | POA: Diagnosis not present

## 2017-03-14 DIAGNOSIS — O09529 Supervision of elderly multigravida, unspecified trimester: Secondary | ICD-10-CM

## 2017-03-14 MED ORDER — TETANUS-DIPHTH-ACELL PERTUSSIS 5-2.5-18.5 LF-MCG/0.5 IM SUSP
0.5000 mL | Freq: Once | INTRAMUSCULAR | Status: AC
  Administered 2017-03-14: 0.5 mL via INTRAMUSCULAR

## 2017-03-14 NOTE — Progress Notes (Signed)
  Subjective  Fetal Movement? yes Contractions? no Leaking Fluid? no Vaginal Bleeding? no  Objective  BP 120/80   Wt 166 lb (75.3 kg)   LMP 08/15/2016 (Exact Date)   BMI 26.79 kg/m  General: NAD Pumonary: no increased work of breathing Abdomen: gravid, non-tender Extremities: no edema Psychiatric: mood appropriate, affect full  Assessment  42 y.o. G3P0020 at [redacted]w[redacted]d by  05/22/2017, by Last Menstrual Period presenting for routine prenatal visit  Plan   Problem List Items Addressed This Visit      Other   Supervision of high risk pregnancy, antepartum, first trimester   Relevant Orders   US OB Follow Up   Antepartum multigravida of advanced maternal age   Relevant Orders   US OB Follow Up    Other Visit Diagnoses    [redacted] weeks gestation of pregnancy    -  Primary    TDaP today  [name], MD, [facility], [facility] 03/14/2017  4:51 PM

## 2017-03-14 NOTE — Patient Instructions (Signed)
Third Trimester of Pregnancy The third trimester is from week 28 through week 40 (months 7 through 9). The third trimester is a time when the unborn baby (fetus) is growing rapidly. At the end of the ninth month, the fetus is about 20 inches in length and weighs 6-10 pounds. Body changes during your third trimester Your body will continue to go through many changes during pregnancy. The changes vary from woman to woman. During the third trimester:  Your weight will continue to increase. You can expect to gain 25-35 pounds (11-16 kg) by the end of the pregnancy.  You may begin to get stretch marks on your hips, abdomen, and breasts.  You may urinate more often because the fetus is moving lower into your pelvis and pressing on your bladder.  You may develop or continue to have heartburn. This is caused by increased hormones that slow down muscles in the digestive tract.  You may develop or continue to have constipation because increased hormones slow digestion and cause the muscles that push waste through your intestines to relax.  You may develop hemorrhoids. These are swollen veins (varicose veins) in the rectum that can itch or be painful.  You may develop swollen, bulging veins (varicose veins) in your legs.  You may have increased body aches in the pelvis, back, or thighs. This is due to weight gain and increased hormones that are relaxing your joints.  You may have changes in your hair. These can include thickening of your hair, rapid growth, and changes in texture. Some women also have hair loss during or after pregnancy, or hair that feels dry or thin. Your hair will most likely return to normal after your baby is born.  Your breasts will continue to grow and they will continue to become tender. A yellow fluid (colostrum) may leak from your breasts. This is the first milk you are producing for your baby.  Your belly button may stick out.  You may notice more swelling in your hands,  face, or ankles.  You may have increased tingling or numbness in your hands, arms, and legs. The skin on your belly may also feel numb.  You may feel short of breath because of your expanding uterus.  You may have more problems sleeping. This can be caused by the size of your belly, increased need to urinate, and an increase in your body's metabolism.  You may notice the fetus "dropping," or moving lower in your abdomen (lightening).  You may have increased vaginal discharge.  You may notice your joints feel loose and you may have pain around your pelvic bone.  What to expect at prenatal visits You will have prenatal exams every 2 weeks until week 36. Then you will have weekly prenatal exams. During a routine prenatal visit:  You will be weighed to make sure you and the baby are growing normally.  Your blood pressure will be taken.  Your abdomen will be measured to track your baby's growth.  The fetal heartbeat will be listened to.  Any test results from the previous visit will be discussed.  You may have a cervical check near your due date to see if your cervix has softened or thinned (effaced).  You will be tested for Group B streptococcus. This happens between 35 and 37 weeks.  Your health care provider may ask you:  What your birth plan is.  How you are feeling.  If you are feeling the baby move.  If you have had   any abnormal symptoms, such as leaking fluid, bleeding, severe headaches, or abdominal cramping.  If you are using any tobacco products, including cigarettes, chewing tobacco, and electronic cigarettes.  If you have any questions.  Other tests or screenings that may be performed during your third trimester include:  Blood tests that check for low iron levels (anemia).  Fetal testing to check the health, activity level, and growth of the fetus. Testing is done if you have certain medical conditions or if there are problems during the  pregnancy.  Nonstress test (NST). This test checks the health of your baby to make sure there are no signs of problems, such as the baby not getting enough oxygen. During this test, a belt is placed around your belly. The baby is made to move, and its heart rate is monitored during movement.  What is false labor? False labor is a condition in which you feel small, irregular tightenings of the muscles in the womb (contractions) that usually go away with rest, changing position, or drinking water. These are called Braxton Hicks contractions. Contractions may last for hours, days, or even weeks before true labor sets in. If contractions come at regular intervals, become more frequent, increase in intensity, or become painful, you should see your health care provider. What are the signs of labor?  Abdominal cramps.  Regular contractions that start at 10 minutes apart and become stronger and more frequent with time.  Contractions that start on the top of the uterus and spread down to the lower abdomen and back.  Increased pelvic pressure and dull back pain.  A watery or bloody mucus discharge that comes from the vagina.  Leaking of amniotic fluid. This is also known as your "water breaking." It could be a slow trickle or a gush. Let your health care provider know if it has a color or strange odor. If you have any of these signs, call your health care provider right away, even if it is before your due date. Follow these instructions at home: Medicines  Follow your health care provider's instructions regarding medicine use. Specific medicines may be either safe or unsafe to take during pregnancy.  Take a prenatal vitamin that contains at least 600 micrograms (mcg) of folic acid.  If you develop constipation, try taking a stool softener if your health care provider approves. Eating and drinking  Eat a balanced diet that includes fresh fruits and vegetables, whole grains, good sources of protein  such as meat, eggs, or tofu, and low-fat dairy. Your health care provider will help you determine the amount of weight gain that is right for you.  Avoid raw meat and uncooked cheese. These carry germs that can cause birth defects in the baby.  If you have low calcium intake from food, talk to your health care provider about whether you should take a daily calcium supplement.  Eat four or five small meals rather than three large meals a day.  Limit foods that are high in fat and processed sugars, such as fried and sweet foods.  To prevent constipation: ? Drink enough fluid to keep your urine clear or pale yellow. ? Eat foods that are high in fiber, such as fresh fruits and vegetables, whole grains, and beans. Activity  Exercise only as directed by your health care provider. Most women can continue their usual exercise routine during pregnancy. Try to exercise for 30 minutes at least 5 days a week. Stop exercising if you experience uterine contractions.  Avoid heavy   lifting.  Do not exercise in extreme heat or humidity, or at high altitudes.  Wear low-heel, comfortable shoes.  Practice good posture.  You may continue to have sex unless your health care provider tells you otherwise. Relieving pain and discomfort  Take frequent breaks and rest with your legs elevated if you have leg cramps or low back pain.  Take warm sitz baths to soothe any pain or discomfort caused by hemorrhoids. Use hemorrhoid cream if your health care provider approves.  Wear a good support bra to prevent discomfort from breast tenderness.  If you develop varicose veins: ? Wear support pantyhose or compression stockings as told by your healthcare provider. ? Elevate your feet for 15 minutes, 3-4 times a day. Prenatal care  Write down your questions. Take them to your prenatal visits.  Keep all your prenatal visits as told by your health care provider. This is important. Safety  Wear your seat belt at  all times when driving.  Make a list of emergency phone numbers, including numbers for family, friends, the hospital, and police and fire departments. General instructions  Avoid cat litter boxes and soil used by cats. These carry germs that can cause birth defects in the baby. If you have a cat, ask someone to clean the litter box for you.  Do not travel far distances unless it is absolutely necessary and only with the approval of your health care provider.  Do not use hot tubs, steam rooms, or saunas.  Do not drink alcohol.  Do not use any products that contain nicotine or tobacco, such as cigarettes and e-cigarettes. If you need help quitting, ask your health care provider.  Do not use any medicinal herbs or unprescribed drugs. These chemicals affect the formation and growth of the baby.  Do not douche or use tampons or scented sanitary pads.  Do not cross your legs for long periods of time.  To prepare for the arrival of your baby: ? Take prenatal classes to understand, practice, and ask questions about labor and delivery. ? Make a trial run to the hospital. ? Visit the hospital and tour the maternity area. ? Arrange for maternity or paternity leave through employers. ? Arrange for family and friends to take care of pets while you are in the hospital. ? Purchase a rear-facing car seat and make sure you know how to install it in your car. ? Pack your hospital bag. ? Prepare the baby's nursery. Make sure to remove all pillows and stuffed animals from the baby's crib to prevent suffocation.  Visit your dentist if you have not gone during your pregnancy. Use a soft toothbrush to brush your teeth and be gentle when you floss. Contact a health care provider if:  You are unsure if you are in labor or if your water has broken.  You become dizzy.  You have mild pelvic cramps, pelvic pressure, or nagging pain in your abdominal area.  You have lower back pain.  You have persistent  nausea, vomiting, or diarrhea.  You have an unusual or bad smelling vaginal discharge.  You have pain when you urinate. Get help right away if:  Your water breaks before 37 weeks.  You have regular contractions less than 5 minutes apart before 37 weeks.  You have a fever.  You are leaking fluid from your vagina.  You have spotting or bleeding from your vagina.  You have severe abdominal pain or cramping.  You have rapid weight loss or weight gain.    You have shortness of breath with chest pain.  You notice sudden or extreme swelling of your face, hands, ankles, feet, or legs.  Your baby makes fewer than 10 movements in 2 hours.  You have severe headaches that do not go away when you take medicine.  You have vision changes. Summary  The third trimester is from week 28 through week 40, months 7 through 9. The third trimester is a time when the unborn baby (fetus) is growing rapidly.  During the third trimester, your discomfort may increase as you and your baby continue to gain weight. You may have abdominal, leg, and back pain, sleeping problems, and an increased need to urinate.  During the third trimester your breasts will keep growing and they will continue to become tender. A yellow fluid (colostrum) may leak from your breasts. This is the first milk you are producing for your baby.  False labor is a condition in which you feel small, irregular tightenings of the muscles in the womb (contractions) that eventually go away. These are called Braxton Hicks contractions. Contractions may last for hours, days, or even weeks before true labor sets in.  Signs of labor can include: abdominal cramps; regular contractions that start at 10 minutes apart and become stronger and more frequent with time; watery or bloody mucus discharge that comes from the vagina; increased pelvic pressure and dull back pain; and leaking of amniotic fluid. This information is not intended to replace advice  given to you by your health care provider. Make sure you discuss any questions you have with your health care provider. Document Released: 02/12/2001 Document Revised: 07/27/2015 Document Reviewed: 04/21/2012 Elsevier Interactive Patient Education  2017 [employer]  

## 2017-03-17 ENCOUNTER — Encounter: Payer: Self-pay | Admitting: Maternal Newborn

## 2017-03-28 ENCOUNTER — Encounter: Payer: Self-pay | Admitting: Maternal Newborn

## 2017-03-28 ENCOUNTER — Ambulatory Visit (INDEPENDENT_AMBULATORY_CARE_PROVIDER_SITE_OTHER): Payer: 59

## 2017-03-28 ENCOUNTER — Ambulatory Visit (INDEPENDENT_AMBULATORY_CARE_PROVIDER_SITE_OTHER): Payer: 59 | Admitting: Maternal Newborn

## 2017-03-28 VITALS — BP 130/80 | Wt 167.0 lb

## 2017-03-28 DIAGNOSIS — O09529 Supervision of elderly multigravida, unspecified trimester: Secondary | ICD-10-CM | POA: Diagnosis not present

## 2017-03-28 DIAGNOSIS — Z3A32 32 weeks gestation of pregnancy: Secondary | ICD-10-CM

## 2017-03-28 DIAGNOSIS — O0991 Supervision of high risk pregnancy, unspecified, first trimester: Secondary | ICD-10-CM

## 2017-03-28 NOTE — Progress Notes (Signed)
    Routine Prenatal Care Visit  Subjective  Kristine Arroyo is a 42 y.o. G3P0020 at [redacted]w[redacted]d being seen today for ongoing prenatal care.  She is currently monitored for the following issues for this high-risk pregnancy and has Supervision of high risk pregnancy, antepartum, first trimester; Celiac disease; Antepartum multigravida of advanced maternal age; and Lumbago on their problem list.  ----------------------------------------------------------------------------------- Patient reports cold and sinus symptoms. Symptoms are gradually improving and mucus is clear. Contractions: Not present. Vag. Bleeding: None.  Movement: Present. Denies leaking of fluid.  ----------------------------------------------------------------------------------- The following portions of the patient's history were reviewed and updated as appropriate: allergies, current medications, past family history, past medical history, past social history, past surgical history and problem list. Problem list updated.   Objective  Last menstrual period 08/15/2016. Pregravid weight 150 lb (68 kg) Total Weight Gain 17 lb (7.711 kg) Urinalysis: Urine Protein: Negative Urine Glucose: Negative  Fetal Status: Fetal Heart Rate (bpm): 130 Fundal Height: 31 cm Movement: Present     General:  Alert, oriented and cooperative. Patient is in no acute distress.  Skin: Skin is warm and dry. No rash noted.   Cardiovascular: Normal heart rate noted  Respiratory: Normal respiratory effort, no problems with respiration noted  Abdomen: Soft, gravid, appropriate for gestational age. Pain/Pressure: Absent     Pelvic:  Cervical exam deferred        Extremities: Normal range of motion.     Mental Status: Normal mood and affect. Normal behavior. Normal judgment and thought content.     Assessment   42 y.o. G3P0020 at [redacted]w[redacted]d, EDD 05/22/2017 by Last Menstrual Period presenting for routine prenatal visit.  Plan   pregnancy 3 Problems (from 08/15/16  to present)    Problem Noted Resolved   Lumbago 12/17/2016 by Rexene Agent, CNM No   Supervision of high risk pregnancy, antepartum, first trimester 10/22/2016 by Rexene Agent, CNM No   Overview Addendum 02/27/2017 10:24 AM by Malachy Mood, MD    [facility] Prenatal Labs  Dating LMP Blood type:   O Pos  Genetic Screen Materniti21 - Neg, It's a girl! Antibody:   Anatomic [place]  Rubella:  Immune  Varicella:    GTT  RPR:   NR  Rhogam N/A HBsAg:   Neg  TDaP vaccine                       Flu Shot:Declines HIV:   NR  Baby Food                                GBS:   Contraception  Pap:  CBB     CS/VBAC    Support Person              Antepartum multigravida of advanced maternal age 38/21/2018 by Rexene Agent, CNM No      Growth ultrasound today, EFW 4 lb 8 oz, 55th percentile, AFI normal.    Preterm labor symptoms and general obstetric precautions including but not limited to vaginal bleeding, contractions, leaking of fluid and fetal movement were reviewed in detail with the patient.  Return in about 2 weeks (around 04/11/2017) for ROB.  Avel Sensor, CNM 03/28/2017  11:56 AM

## 2017-03-28 NOTE — Progress Notes (Signed)
No concerns.rj 

## 2017-04-11 ENCOUNTER — Encounter: Payer: Self-pay | Admitting: Obstetrics and Gynecology

## 2017-04-11 ENCOUNTER — Ambulatory Visit (INDEPENDENT_AMBULATORY_CARE_PROVIDER_SITE_OTHER): Payer: 59 | Admitting: Obstetrics and Gynecology

## 2017-04-11 VITALS — BP 120/82 | Wt 171.0 lb

## 2017-04-11 DIAGNOSIS — Z3A34 34 weeks gestation of pregnancy: Secondary | ICD-10-CM

## 2017-04-11 DIAGNOSIS — O09529 Supervision of elderly multigravida, unspecified trimester: Secondary | ICD-10-CM

## 2017-04-11 DIAGNOSIS — O0991 Supervision of high risk pregnancy, unspecified, first trimester: Secondary | ICD-10-CM

## 2017-04-11 NOTE — Progress Notes (Signed)
  Routine Prenatal Care Visit  Subjective  Kristine Arroyo is a 42 y.o. G3P0020 at [redacted]w[redacted]d being seen today for ongoing prenatal care.  She is currently monitored for the following issues for this high-risk pregnancy and has Supervision of high risk pregnancy, antepartum, first trimester; Celiac disease; Antepartum multigravida of advanced maternal age; and Lumbago on their problem list.  ----------------------------------------------------------------------------------- Patient reports no complaints.   Contractions: Not present. Vag. Bleeding: None.  Movement: Present. Denies leaking of fluid.  ----------------------------------------------------------------------------------- The following portions of the patient's history were reviewed and updated as appropriate: allergies, current medications, past family history, past medical history, past social history, past surgical history and problem list. Problem list updated.   Objective  Blood pressure 120/82, weight 171 lb (77.6 kg), last menstrual period 08/15/2016. Pregravid weight 150 lb (68 kg) Total Weight Gain 21 lb (9.526 kg) Urinalysis: Urine Protein: Negative Urine Glucose: Negative  Fetal Status: Fetal Heart Rate (bpm): 140 Fundal Height: 34 cm Movement: Present     General:  Alert, oriented and cooperative. Patient is in no acute distress.  Skin: Skin is warm and dry. No rash noted.   Cardiovascular: Normal heart rate noted  Respiratory: Normal respiratory effort, no problems with respiration noted  Abdomen: Soft, gravid, appropriate for gestational age. Pain/Pressure: Present     Pelvic:  Cervical exam deferred        Extremities: Normal range of motion.     Mental Status: Normal mood and affect. Normal behavior. Normal judgment and thought content.   Assessment   42 y.o. G3P0020 at [redacted]w[redacted]d by  05/22/2017, by Last Menstrual Period presenting for routine prenatal visit  Plan   pregnancy 3 Problems (from 08/15/16 to present)    Problem Noted Resolved   Lumbago 12/17/2016 by Rexene Agent, CNM No   Supervision of high risk pregnancy, antepartum, first trimester 10/22/2016 by Rexene Agent, CNM No   Overview Addendum 02/27/2017 10:24 AM by Malachy Mood, MD    [facility]inic [facility] Prenatal Labs  Dating LMP Blood type:   O Pos  Genetic Screen Materniti21 - Neg, It's a girl! Antibody:   Anatomic [place]  Rubella:  Immune  Varicella:    GTT  RPR:   NR  Rhogam N/A HBsAg:   Neg  TDaP vaccine                       Flu Shot:Declines HIV:   NR  Baby Food                                GBS:   Contraception  Pap:  CBB     CS/VBAC    Support Person              Antepartum multigravida of advanced maternal age 80/21/2018 by Rexene Agent, CNM No       Preterm labor symptoms and general obstetric precautions including but not limited to vaginal bleeding, contractions, leaking of fluid and fetal movement were reviewed in detail with the patient. Please refer to After Visit Summary for other counseling recommendations.   Return in about 2 weeks (around 04/25/2017) for schedule growth u/s and rob/NST.  Prentice Docker, MD  04/11/2017 5:14 PM

## 2017-04-24 ENCOUNTER — Encounter: Payer: 59 | Admitting: Obstetrics and Gynecology

## 2017-04-24 ENCOUNTER — Other Ambulatory Visit: Payer: 59

## 2017-04-28 ENCOUNTER — Ambulatory Visit (INDEPENDENT_AMBULATORY_CARE_PROVIDER_SITE_OTHER): Payer: 59 | Admitting: Obstetrics & Gynecology

## 2017-04-28 ENCOUNTER — Ambulatory Visit (INDEPENDENT_AMBULATORY_CARE_PROVIDER_SITE_OTHER): Payer: 59

## 2017-04-28 VITALS — BP 120/80 | Wt 174.0 lb

## 2017-04-28 DIAGNOSIS — O09529 Supervision of elderly multigravida, unspecified trimester: Secondary | ICD-10-CM

## 2017-04-28 DIAGNOSIS — O0991 Supervision of high risk pregnancy, unspecified, first trimester: Secondary | ICD-10-CM

## 2017-04-28 DIAGNOSIS — Z3A36 36 weeks gestation of pregnancy: Secondary | ICD-10-CM

## 2017-04-28 LAB — OB RESULTS CONSOLE GBS: STREP GROUP B AG: NEGATIVE

## 2017-04-28 NOTE — Progress Notes (Signed)
  Subjective  Fetal Movement? yes Contractions? no Leaking Fluid? no Vaginal Bleeding? no  Objective  BP 120/80   Wt 174 lb (78.9 kg)   LMP 08/15/2016 (Exact Date)   BMI 28.08 kg/m  General: NAD Pumonary: no increased work of breathing Abdomen: gravid, non-tender Extremities: no edema Psychiatric: mood appropriate, affect full SVE 0/30/-3, soft, post Assessment  42 y.o. G3P0020 at [redacted]w[redacted]d by  05/22/2017, by Last Menstrual Period presenting for routine prenatal visit  Plan   Problem List Items Addressed This Visit      Other   Supervision of high risk pregnancy, antepartum, first trimester   Relevant Orders   US OB Limited   Antepartum multigravida of advanced maternal age   Relevant Orders   US OB Limited    Other Visit Diagnoses    [redacted] weeks gestation of pregnancy    -  Primary   Relevant Orders   US OB Limited   Culture, beta strep (group b only)    NST AFI weekly due to [place] (wants to get back on Thurs tract) PNV, [place], PTL precautions GBS  A NST procedure was performed with FHR monitoring and a normal baseline established, appropriate time of 20-40 minutes of evaluation, and accels >2 seen w 15x15 characteristics.  Results show a REACTIVE NST.   Review of ULTRASOUND.    I have personally reviewed images and report of recent ultrasound done [facility].    Plan of management to be discussed with patient.  [name], MD, [facility], [facility] 04/28/2017  4:48 PM

## 2017-05-01 ENCOUNTER — Encounter: Payer: Self-pay | Admitting: Maternal Newborn

## 2017-05-01 ENCOUNTER — Other Ambulatory Visit: Payer: Self-pay

## 2017-05-01 ENCOUNTER — Ambulatory Visit (INDEPENDENT_AMBULATORY_CARE_PROVIDER_SITE_OTHER): Payer: 59 | Admitting: Maternal Newborn

## 2017-05-01 ENCOUNTER — Inpatient Hospital Stay
Admission: EM | Admit: 2017-05-01 | Discharge: 2017-05-05 | DRG: 788 | Disposition: A | Discharge status: Home / Self Care | Payer: 59 | Attending: Obstetrics & Gynecology | Admitting: Obstetrics & Gynecology

## 2017-05-01 VITALS — BP 150/100 | Wt 174.0 lb

## 2017-05-01 DIAGNOSIS — O9902 Anemia complicating childbirth: Secondary | ICD-10-CM | POA: Diagnosis present

## 2017-05-01 DIAGNOSIS — Z362 Encounter for other antenatal screening follow-up: Secondary | ICD-10-CM

## 2017-05-01 DIAGNOSIS — D649 Anemia, unspecified: Secondary | ICD-10-CM | POA: Diagnosis present

## 2017-05-01 DIAGNOSIS — O134 Gestational [pregnancy-induced] hypertension without significant proteinuria, complicating childbirth: Secondary | ICD-10-CM

## 2017-05-01 DIAGNOSIS — O163 Unspecified maternal hypertension, third trimester: Secondary | ICD-10-CM | POA: Diagnosis present

## 2017-05-01 DIAGNOSIS — O133 Gestational [pregnancy-induced] hypertension without significant proteinuria, third trimester: Secondary | ICD-10-CM

## 2017-05-01 DIAGNOSIS — O09529 Supervision of elderly multigravida, unspecified trimester: Secondary | ICD-10-CM

## 2017-05-01 DIAGNOSIS — O0991 Supervision of high risk pregnancy, unspecified, first trimester: Secondary | ICD-10-CM

## 2017-05-01 DIAGNOSIS — O139 Gestational [pregnancy-induced] hypertension without significant proteinuria, unspecified trimester: Secondary | ICD-10-CM | POA: Diagnosis present

## 2017-05-01 DIAGNOSIS — R030 Elevated blood-pressure reading, without diagnosis of hypertension: Secondary | ICD-10-CM

## 2017-05-01 DIAGNOSIS — Z3A37 37 weeks gestation of pregnancy: Secondary | ICD-10-CM

## 2017-05-01 LAB — COMPREHENSIVE METABOLIC PANEL
ALT: 13 U/L — ABNORMAL LOW (ref 14–54)
ANION GAP: 9 (ref 5–15)
AST: 20 U/L (ref 15–41)
Albumin: 3.2 g/dL — ABNORMAL LOW (ref 3.5–5.0)
Alkaline Phosphatase: 117 U/L (ref 38–126)
BILIRUBIN TOTAL: 0.4 mg/dL (ref 0.3–1.2)
BUN: 9 mg/dL (ref 6–20)
CALCIUM: 8.9 mg/dL (ref 8.9–10.3)
CO2: 20 mmol/L — ABNORMAL LOW (ref 22–32)
Chloride: 105 mmol/L (ref 101–111)
Creatinine, Ser: 0.63 mg/dL (ref 0.44–1.00)
Glucose, Bld: 86 mg/dL (ref 65–99)
Potassium: 3.8 mmol/L (ref 3.5–5.1)
SODIUM: 134 mmol/L — AB (ref 135–145)
TOTAL PROTEIN: 6.7 g/dL (ref 6.5–8.1)

## 2017-05-01 LAB — CBC
HEMATOCRIT: 34.8 % — AB (ref 35.0–47.0)
Hemoglobin: 11.9 g/dL — ABNORMAL LOW (ref 12.0–16.0)
MCH: 29.4 pg (ref 26.0–34.0)
MCHC: 34.2 g/dL (ref 32.0–36.0)
MCV: 85.9 fL (ref 80.0–100.0)
Platelets: 204 10*3/uL (ref 150–440)
RBC: 4.05 MIL/uL (ref 3.80–5.20)
RDW: 14.9 % — ABNORMAL HIGH (ref 11.5–14.5)
WBC: 10.8 10*3/uL (ref 3.6–11.0)

## 2017-05-01 LAB — PROTEIN / CREATININE RATIO, URINE: Creatinine, Urine: 23 mg/dL

## 2017-05-01 LAB — TYPE AND SCREEN
ABO/RH(D): O POS
Antibody Screen: NEGATIVE

## 2017-05-01 MED ORDER — LACTATED RINGERS IV SOLN
INTRAVENOUS | Status: DC
  Administered 2017-05-01 – 2017-05-03 (×2): via INTRAVENOUS

## 2017-05-01 MED ORDER — LIDOCAINE HCL (PF) 1 % IJ SOLN: 30.0000 mL | INTRAMUSCULAR | Status: DC | PRN

## 2017-05-01 MED ORDER — ACETAMINOPHEN 325 MG PO TABS: 650.0000 mg | ORAL_TABLET | ORAL | Status: DC | PRN

## 2017-05-01 MED ORDER — HYDRALAZINE HCL 20 MG/ML IJ SOLN: 5.0000 mg | INTRAMUSCULAR | Status: DC | PRN

## 2017-05-01 MED ORDER — MISOPROSTOL 200 MCG PO TABS
ORAL_TABLET | ORAL | Status: AC
  Administered 2017-05-01: 25 ug via VAGINAL
  Filled 2017-05-01: qty 4

## 2017-05-01 MED ORDER — LABETALOL HCL 5 MG/ML IV SOLN: 20.0000 mg | INTRAVENOUS | Status: DC | PRN

## 2017-05-01 MED ORDER — AMMONIA AROMATIC IN INHA
RESPIRATORY_TRACT | Status: AC
  Filled 2017-05-01: qty 10

## 2017-05-01 MED ORDER — ONDANSETRON HCL 4 MG/2ML IJ SOLN: 4.0000 mg | Freq: Four times a day (QID) | INTRAMUSCULAR | Status: DC | PRN

## 2017-05-01 MED ORDER — LIDOCAINE HCL (PF) 1 % IJ SOLN
INTRAMUSCULAR | Status: AC
  Filled 2017-05-01: qty 30

## 2017-05-01 MED ORDER — OXYTOCIN BOLUS FROM INFUSION: 500.0000 mL | Freq: Once | INTRAVENOUS | Status: DC

## 2017-05-01 MED ORDER — SOD CITRATE-CITRIC ACID 500-334 MG/5ML PO SOLN
30.0000 mL | ORAL | Status: DC | PRN
  Filled 2017-05-01: qty 15

## 2017-05-01 MED ORDER — OXYTOCIN 10 UNIT/ML IJ SOLN
INTRAMUSCULAR | Status: AC
  Filled 2017-05-01: qty 2

## 2017-05-01 MED ORDER — OXYTOCIN 40 UNITS IN LACTATED RINGERS INFUSION - SIMPLE MED
2.5000 [IU]/h | INTRAVENOUS | Status: DC
  Filled 2017-05-01 (×3): qty 1000

## 2017-05-01 MED ORDER — TERBUTALINE SULFATE 1 MG/ML IJ SOLN: 0.2500 mg | Freq: Once | INTRAMUSCULAR | Status: DC | PRN

## 2017-05-01 MED ORDER — MISOPROSTOL 25 MCG QUARTER TABLET
25.0000 ug | ORAL_TABLET | ORAL | Status: DC | PRN
  Administered 2017-05-01 – 2017-05-03 (×6): 25 ug via VAGINAL
  Filled 2017-05-01 (×6): qty 1

## 2017-05-01 MED ORDER — LACTATED RINGERS IV SOLN: 500.0000 mL | INTRAVENOUS | Status: DC | PRN

## 2017-05-01 NOTE — H&P (Addendum)
Obstetric H&P   Chief Complaint: Elevated BP in clinic  Prenatal Care Provider: [facility]  History of Present Illness: 41 y.o. G3P0020 [redacted]w[redacted]d by 05/22/2017, by Last Menstrual Period presenting to L&D after elevated BP in office today. Patient asymptomatic no headaches, vision chagnes. RUQ/epigastric pain, or increased edema.  No history of CHTN.  +FM, no LOF, no VB, no ctx.  PNC notable for transfer of care from [place] and [place].    Pregravid weight 150 lb (68 kg) Total Weight Gain 26 lb (11.8 kg)  pregnancy 3 Problems (from 08/15/16 to present)    Problem Noted Resolved   Lumbago 12/17/2016 by Kristine Arroyo, CNM No   Supervision of high risk pregnancy, antepartum, first trimester 10/22/2016 by Kristine Arroyo, CNM No   Overview Addendum 02/27/2017 10:24 AM by Kristine Mood, MD    [facility] Prenatal Labs  Dating LMP Blood type:   O Pos  Genetic Screen Materniti21 - Neg, It's a girl! Antibody:   Anatomic [place] Normal female Rubella:  Immune  Varicella:    GTT 88 RPR:   NR  Rhogam N/A HBsAg:   Neg  TDaP vaccine 03/14/17 Flu Shot:Declines HIV:   NR  Baby Food Breast                           GBS:  UNKOWN  Contraception  Pap:  CBB     CS/VBAC    Support Person              Antepartum multigravida of advanced maternal age 67/21/2018 by Kristine Arroyo, CNM No       Review of Systems: 10 point review of systems negative unless otherwise noted in HPI  Past Medical History: Past Medical History:  Diagnosis Date  . Celiac disease     Past Surgical History: Past Surgical History:  Procedure Laterality Date  . HERNIA REPAIR      Past Obstetric History: #: 1, Date: None, Sex: None, Weight: None, GA: None, Delivery: None, Apgar1: None, Apgar5: None, Living: None, Birth Comments: None  #: 2, Date: None, Sex: None, Weight: None, GA: None, Delivery: None, Apgar1: None, Apgar5: None, Living: None, Birth Comments: None  #: 3, Date: None, Sex: None, Weight: None, GA: None,  Delivery: None, Apgar1: None, Apgar5: None, Living: None, Birth Comments: None   Past Gynecologic History:  Family History: Family History  Problem Relation Age of Onset  . Cancer Maternal Grandfather   . Cancer Paternal Grandmother     Social History: Social History   Socioeconomic History  . Marital status: Married    Spouse name: Not on file  . Number of children: Not on file  . Years of education: Not on file  . Highest education level: Not on file  Social Needs  . Financial resource strain: Not on file  . Food insecurity - worry: Not on file  . Food insecurity - inability: Not on file  . Transportation needs - medical: Not on file  . Transportation needs - non-medical: Not on file  Occupational History  . Not on file  Tobacco Use  . Smoking status: Never Smoker  . Smokeless tobacco: Never Used  Substance and Sexual Activity  . Alcohol use: No  . Drug use: No  . Sexual activity: Yes  Other Topics Concern  . Not on file  Social History Narrative  . Not on file    Medications: Prior to Admission medications  Medication Sig Start Date End Date Taking? Authorizing Provider  folic acid (FOLVITE) 1 MG tablet  11/27/16  Yes [provider]  Prenatal Vit-Fe Fumarate-FA (PRENATAL MULTIVITAMIN) TABS tablet Take 1 tablet by mouth daily at 12 noon.   Yes [provider]  cyclobenzaprine (FLEXERIL) 10 MG tablet Take 1 tablet (10 mg total) by mouth 3 (three) times daily as needed for muscle spasms. Patient not taking: Reported on 02/27/2017 12/17/16   Kristine Arroyo, CNM  HYDROcodone-acetaminophen (NORCO/VICODIN) 5-325 MG tablet Take 1 tablet by mouth every 6 (six) hours as needed for moderate pain. Patient not taking: Reported on 02/27/2017 10/27/16   Kristine Amor, MD    Allergies: Allergies  Allergen Reactions  . Gluten Meal Nausea And Vomiting    Compares to food poisoning    Physical Exam: Vitals: Blood pressure (!) 143/98, pulse 79,  temperature 97.8 F (36.6 C), temperature source Oral, height 5\' 6"  (1.676 m), weight 176 lb (79.8 kg), last menstrual period 08/15/2016.  Urine Dip Protein: see P/C ratio  FHT:125, moderate, +accels, no decels Toco: irregular  General: NAD HEENT: normocephalic, anicteric Pulmonary: No increased work of breathing Cardiovascular: RRR, distal pulses 2+ Abdomen: Gravid, non-tender Genitourinary: closed in clinic Extremities: no edema, erythema, or tenderness Neurologic: Grossly intact Psychiatric: Arroyo appropriate, affect full  Labs: Results for orders placed or performed during the hospital encounter of 05/01/17 (from the past 24 hour(s))  Protein / creatinine ratio, urine     Status: None   Collection Time: 05/01/17  5:02 PM  Result Value Ref Range   Creatinine, Urine 23 mg/dL   Total Protein, Urine <6 mg/dL   Protein Creatinine Ratio        0.00 - 0.15 mg/mg[Cre]  CBC     Status: Abnormal   Collection Time: 05/01/17  5:06 PM  Result Value Ref Range   WBC 10.8 3.6 - 11.0 K/uL   RBC 4.05 3.80 - 5.20 MIL/uL   Hemoglobin 11.9 (L) 12.0 - 16.0 g/dL   HCT 34.8 (L) 35.0 - 47.0 %   MCV 85.9 80.0 - 100.0 fL   MCH 29.4 26.0 - 34.0 pg   MCHC 34.2 32.0 - 36.0 g/dL   RDW 14.9 (H) 11.5 - 14.5 %   Platelets 204 150 - 440 K/uL  Comprehensive metabolic panel     Status: Abnormal   Collection Time: 05/01/17  5:06 PM  Result Value Ref Range   Sodium 134 (L) 135 - 145 mmol/L   Potassium 3.8 3.5 - 5.1 mmol/L   Chloride 105 101 - 111 mmol/L   CO2 20 (L) 22 - 32 mmol/L   Glucose, Bld 86 65 - 99 mg/dL   BUN 9 6 - 20 mg/dL   Creatinine, Ser 0.63 0.44 - 1.00 mg/dL   Calcium 8.9 8.9 - 10.3 mg/dL   Total Protein 6.7 6.5 - 8.1 g/dL   Albumin 3.2 (L) 3.5 - 5.0 g/dL   AST 20 15 - 41 U/L   ALT 13 (L) 14 - 54 U/L   Alkaline Phosphatase 117 38 - 126 U/L   Total Bilirubin 0.4 0.3 - 1.2 mg/dL   GFR calc non Af Amer >60 >60 mL/min   GFR calc Af Amer >60 >60 mL/min   Anion gap 9 5 - 15   US Ob  Follow Up  Result Date: 04/28/2017 ULTRASOUND REPORT Location: [facility] Date of Service: 04/28/2017 Indications:AMA Findings: Kristine Arroyo intrauterine pregnancy is visualized with FHR at 136 BPM. Biometrics give an (  U/S) Gestational age of [redacted]w[redacted]d and an (U/S) EDD of 05/29/2017; this correlates with the clinically established Estimated Date of Delivery: 05/22/17. Fetal presentation is Cephalic. Placenta: Posterior, Grade 1. AFI: 13.49 cm Growth percentile is 31st. EFW: 6lbs 0oz Impression: 1. [redacted]w[redacted]d Viable [place] Intrauterine pregnancy previously established criteria. 2. Growth is 31st %ile.  AFI is 13.49 cm. Recommendations: 1.Clinical correlation with the patient's History and Physical Exam. Edwena Bunde, RDMS, RVT Review of ULTRASOUND.    I have personally reviewed images and report of recent ultrasound done at [facility].    Plan of management to be discussed with patient. [name], MD, [facility], [facility] 04/28/2017  5:28 PM    Assessment: 42 y.o. G3P0020 [redacted]w[redacted]d by 05/22/2017, by Last Menstrual Period presenting with GHTN  Plan: 1) GHTN -  Proceed with IOL secondary to GHTN at 37 weeks ACOG "Hypertension in Pregnancy" Task Force Report November 14th 2013  - cytotec 13mcg  2) Fetus - cat I tracing, EFW 31%ile at 36 weeks  3) PNL - Blood type O pos/ Anti-bodyscreen negative/ Rubella  Immune / Varicella Immune / RPR Non Reactive (12/27 1021) / HBsAg  Neg / HIV Non Reactive (12/27 1021) / 1-hr OGTT 88 / GBS  unknown  - In the setting of unknown GBS status at greater than [redacted] weeks gestation, GBS prophylaxis is not indicated unless delivery has not occurred 18-hr after onset of membrane rupture or the patient has a history of prior neonate with GBS sequela (2010 CDC "Guidlines for the Prevention of Perinatal Group B Streptococcal Disease" risk based screening approach for GBS unknown)   4) Immunization History -  Immunization History  Administered Date(s)  Administered  . Tdap 03/14/2017    5) Disposition - pending delivery  Kristine Mood, MD, [place], [facility] 05/01/2017, 6:51 PM

## 2017-05-01 NOTE — Progress Notes (Signed)
Routine Prenatal Care Visit  Subjective  Kristine Arroyo is a 42 y.o. G3P0020 at [redacted]w[redacted]d being seen today for ongoing prenatal care.  She is currently monitored for the following issues for this high-risk pregnancy and has Supervision of high risk pregnancy, antepartum, first trimester; Celiac disease; Antepartum multigravida of advanced maternal age; and Lumbago on their problem list.  ----------------------------------------------------------------------------------- Patient reports pubic bone pain and pelvic pressure.  Denies headache, epigastric pain, visual changes. Contractions: Not present. Vag. Bleeding: None.  Movement: Present. Denies leaking of fluid.  ----------------------------------------------------------------------------------- The following portions of the patient's history were reviewed and updated as appropriate: allergies, current medications, past family history, past medical history, past social history, past surgical history and problem list. Problem list updated.   Objective   Vitals:   05/01/17 1516 05/01/17 1609  BP: 130/90 (!) 150/100   Last menstrual period 08/15/2016. Pregravid weight 150 lb (68 kg) Total Weight Gain 24 lb (10.9 kg) Urinalysis: Urine Protein: Negative Urine Glucose: Negative  Fetal Status: Fetal Heart Rate (bpm): 130 Fundal Height: 37 cm Movement: Present  Presentation: Vertex  General:  Alert, oriented and cooperative. Patient is in no acute distress.  Skin: Skin is warm and dry. No rash noted.   Cardiovascular: Normal heart rate noted  Respiratory: Normal respiratory effort, no problems with respiration noted  Abdomen: Soft, gravid, appropriate for gestational age. Pain/Pressure: Present     Pelvic:  Cervical exam performed Dilation: Closed Effacement (%): 30 Station: -3  Extremities: Normal range of motion.     Mental Status: Normal mood and affect. Normal behavior. Normal judgment and thought content.   NST Baseline:  125 Variability: moderate Accelerations: present Decelerations: absent Tocometry: none The patient was monitored for 20+ minutes, fetal heart rate tracing was deemed reactive, category I tracing.   Assessment   42 y.o. G3P0020 at [redacted]w[redacted]d, EDD 05/22/2017 by Last Menstrual Period presenting for routine prenatal visit.  Plan   pregnancy 3 Problems (from 08/15/16 to present)    Problem Noted Resolved   Lumbago 12/17/2016 by Rexene Agent, CNM No   Supervision of high risk pregnancy, antepartum, first trimester 10/22/2016 by Rexene Agent, CNM No   Overview Addendum 02/27/2017 10:24 AM by Malachy Mood, MD    [facility]inic [facility] Prenatal Labs  Dating LMP Blood type:   O Pos  Genetic Screen Materniti21 - Neg, It's a girl! Antibody:   Anatomic [place]  Rubella:  Immune  Varicella:    GTT  RPR:   NR  Rhogam N/A HBsAg:   Neg  TDaP vaccine                       Flu Shot:Declines HIV:   NR  Baby Food                                GBS:   Contraception  Pap:  CBB     CS/VBAC    Support Person              Antepartum multigravida of advanced maternal age 33/21/2018 by Rexene Agent, CNM No      Repeat blood pressure 150/100, sent to labor and delivery to rule out pre-eclampsia.  FWB reassuring, NST reactive Advised exercise ball to help relieve pelvic pressure.  Term labor symptoms and general obstetric precautions including but not limited to vaginal bleeding, contractions, leaking of fluid and fetal movement were reviewed  in detail with the patient.  Return in about 1 week (around 05/08/2017) for [place] with AFI/NST.  Avel Sensor, CNM 05/01/2017  4:53 PM

## 2017-05-01 NOTE — Progress Notes (Signed)
Progress Note:  05/01/2017 8:37 PM  Kristine Arroyo  has presented today for gestational HTN and subsequent plan for INDUCTION OF LABOR (cervical ripening agents),  with the diagnosis of Gestational hypertension. The various methods of treatment have been discussed with the patient and family. After consideration of risks, benefits and other options for treatment, the patient has consented to  Labor induction .  The patient's history has been reviewed, patient examined, no change in status, and is stable for induction as planned.  See H&P. I have reviewed the patient's chart and labs.  Questions were answered to the patient's satisfaction.    [name], MD, [facility], [facility] 05/01/2017  8:37 PM

## 2017-05-01 NOTE — OB Triage Note (Signed)
Pt. Has been sent over from [facility] office for high blood pressures at the office earlier today. (130/90, 150/100). Pt. Denies blurred vision, epigastric pain, or urinary incontinence.She has had a slight headache upon entering the hospital. States that she has dizziness that started prior to pregnancy with quick movement. Denies having contractions.  Denies having any vaginal bleeding, and normal vaginal discharge. Monitors have been applied to assess FHT, and BP is cycling every 15 minutes.

## 2017-05-01 NOTE — Progress Notes (Signed)
C/o has been walking, doing squats, drinking red rasberry tea. rj

## 2017-05-02 LAB — CULTURE, BETA STREP (GROUP B ONLY): STREP GP B CULTURE: NEGATIVE

## 2017-05-02 MED ORDER — BUTORPHANOL TARTRATE 1 MG/ML IJ SOLN
INTRAMUSCULAR | Status: AC
  Filled 2017-05-02: qty 1

## 2017-05-02 MED ORDER — BUTORPHANOL TARTRATE 1 MG/ML IJ SOLN
1.0000 mg | INTRAMUSCULAR | Status: DC | PRN
  Administered 2017-05-02: 1 mg via INTRAVENOUS

## 2017-05-02 NOTE — Progress Notes (Signed)
  Labor Progress Note   42 y.o. [id number] @ [redacted]w[redacted]d , admitted for  Pregnancy, Labor Management. Induction of Labor at term due to Gestational Hypertension  Subjective:  Refreshed after showering and eating a light breakfast. Has not had much rest overnight due to frequent contractions from Cytotec, describes pain as "cramping."   Objective:  BP (!) 144/90   Pulse 74   Temp (!) 97.5 F (36.4 C) (Oral)   Resp 16   Ht 5\' 6"  (1.676 m)   Wt 176 lb (79.8 kg)   LMP 08/15/2016 (Exact Date)   BMI 28.41 kg/m  Abd: mild Extr: trace to 1+ bilateral pedal edema SVE: no change from previous exam per RN: 0/30/-3  EFM: FHR: 150 bpm, variability: moderate,  accelerations:  Present,  decelerations:  Absent Toco: uterine irritability Labs: I have reviewed the patient's lab results.   Assessment & Plan:  [id number] @ [redacted]w[redacted]d, admitted for  Pregnancy and Labor/Delivery Management, IOL for gHTN  1. Pain management: IV sedation and epidural on request. 2. FWB: FHT category I.  3. ID: GBS negative 4. Labor management: continue with next dose of Cytotec as contractions have spaced out. Continue to monitor for cervical change.  All discussed with patient.  Avel Sensor, CNM 05/02/2017  09:45 AM

## 2017-05-03 ENCOUNTER — Inpatient Hospital Stay: Payer: 59 | Admitting: Registered Nurse

## 2017-05-03 ENCOUNTER — Encounter
Admission: EM | Disposition: A | Discharge status: Home / Self Care | Payer: Self-pay | Source: Home / Self Care | Attending: Obstetrics & Gynecology

## 2017-05-03 DIAGNOSIS — Z3A37 37 weeks gestation of pregnancy: Secondary | ICD-10-CM

## 2017-05-03 DIAGNOSIS — O134 Gestational [pregnancy-induced] hypertension without significant proteinuria, complicating childbirth: Secondary | ICD-10-CM

## 2017-05-03 DIAGNOSIS — O139 Gestational [pregnancy-induced] hypertension without significant proteinuria, unspecified trimester: Secondary | ICD-10-CM

## 2017-05-03 LAB — CBC
HEMATOCRIT: 36.2 % (ref 35.0–47.0)
Hemoglobin: 12.1 g/dL (ref 12.0–16.0)
MCH: 29.2 pg (ref 26.0–34.0)
MCHC: 33.4 g/dL (ref 32.0–36.0)
MCV: 87.2 fL (ref 80.0–100.0)
Platelets: 202 10*3/uL (ref 150–440)
RBC: 4.15 MIL/uL (ref 3.80–5.20)
RDW: 15.1 % — AB (ref 11.5–14.5)
WBC: 12.7 10*3/uL — ABNORMAL HIGH (ref 3.6–11.0)

## 2017-05-03 LAB — TYPE AND SCREEN
ABO/RH(D): O POS
Antibody Screen: NEGATIVE

## 2017-05-03 LAB — RPR: RPR: NONREACTIVE

## 2017-05-03 SURGERY — Surgical Case
Anesthesia: Spinal

## 2017-05-03 MED ORDER — SOD CITRATE-CITRIC ACID 500-334 MG/5ML PO SOLN
30.0000 mL | ORAL | Status: AC
  Administered 2017-05-03: 30 mL via ORAL

## 2017-05-03 MED ORDER — NALBUPHINE HCL 10 MG/ML IJ SOLN: 5.0000 mg | INTRAMUSCULAR | Status: DC | PRN

## 2017-05-03 MED ORDER — ZOLPIDEM TARTRATE 5 MG PO TABS: 5.0000 mg | ORAL_TABLET | Freq: Every evening | ORAL | Status: DC | PRN

## 2017-05-03 MED ORDER — OXYTOCIN 40 UNITS IN LACTATED RINGERS INFUSION - SIMPLE MED
INTRAVENOUS | Status: DC | PRN
  Administered 2017-05-03: 500 mL via INTRAVENOUS

## 2017-05-03 MED ORDER — LABETALOL HCL 200 MG PO TABS
200.0000 mg | ORAL_TABLET | Freq: Two times a day (BID) | ORAL | Status: DC
  Administered 2017-05-03 – 2017-05-05 (×4): 200 mg via ORAL
  Filled 2017-05-03 (×2): qty 1
  Filled 2017-05-03 (×2): qty 2

## 2017-05-03 MED ORDER — BUPIVACAINE HCL 0.5 % IJ SOLN
10.0000 mL | Freq: Once | INTRAMUSCULAR | Status: DC
  Filled 2017-05-03: qty 10

## 2017-05-03 MED ORDER — BUPIVACAINE 0.25 % ON-Q PUMP DUAL CATH 400 ML
400.0000 mL | INJECTION | Status: DC
  Filled 2017-05-03: qty 400

## 2017-05-03 MED ORDER — PRENATAL MULTIVITAMIN CH
1.0000 | ORAL_TABLET | Freq: Every day | ORAL | Status: DC
  Administered 2017-05-04: 1 via ORAL
  Filled 2017-05-03: qty 1

## 2017-05-03 MED ORDER — KETOROLAC TROMETHAMINE 30 MG/ML IJ SOLN: 30.0000 mg | Freq: Four times a day (QID) | INTRAMUSCULAR | Status: DC | PRN

## 2017-05-03 MED ORDER — LACTATED RINGERS IV SOLN
INTRAVENOUS | Status: DC
  Administered 2017-05-03: 600 mL via INTRAVENOUS

## 2017-05-03 MED ORDER — SODIUM CHLORIDE 0.9% FLUSH
3.0000 mL | INTRAVENOUS | Status: DC | PRN
Start: 2017-05-03 — End: 2017-05-03

## 2017-05-03 MED ORDER — WITCH HAZEL-GLYCERIN EX PADS: 1.0000 "application " | MEDICATED_PAD | CUTANEOUS | Status: DC | PRN

## 2017-05-03 MED ORDER — MORPHINE SULFATE (PF) 0.5 MG/ML IJ SOLN
INTRAMUSCULAR | Status: AC
  Filled 2017-05-03: qty 10

## 2017-05-03 MED ORDER — LIDOCAINE HCL (PF) 1 % IJ SOLN
INTRAMUSCULAR | Status: DC | PRN
  Administered 2017-05-03: 3 mL via SUBCUTANEOUS

## 2017-05-03 MED ORDER — SIMETHICONE 80 MG PO CHEW: 80.0000 mg | CHEWABLE_TABLET | ORAL | Status: DC | PRN

## 2017-05-03 MED ORDER — SODIUM CHLORIDE FLUSH 0.9 % IV SOLN
INTRAVENOUS | Status: AC
  Filled 2017-05-03: qty 10

## 2017-05-03 MED ORDER — NALBUPHINE HCL 10 MG/ML IJ SOLN: 5.0000 mg | Freq: Once | INTRAMUSCULAR | Status: DC | PRN

## 2017-05-03 MED ORDER — TETANUS-DIPHTH-ACELL PERTUSSIS 5-2.5-18.5 LF-MCG/0.5 IM SUSP: 0.5000 mL | Freq: Once | INTRAMUSCULAR | Status: DC

## 2017-05-03 MED ORDER — MORPHINE SULFATE (PF) 0.5 MG/ML IJ SOLN
INTRAMUSCULAR | Status: DC | PRN
  Administered 2017-05-03: .1 mg via INTRATHECAL

## 2017-05-03 MED ORDER — CEFOXITIN SODIUM-DEXTROSE 2-2.2 GM-%(50ML) IV SOLR
2.0000 g | INTRAVENOUS | Status: AC
  Administered 2017-05-03: 2 g via INTRAVENOUS
  Filled 2017-05-03: qty 50

## 2017-05-03 MED ORDER — DIBUCAINE 1 % RE OINT: 1.0000 "application " | TOPICAL_OINTMENT | RECTAL | Status: DC | PRN

## 2017-05-03 MED ORDER — FENTANYL CITRATE (PF) 100 MCG/2ML IJ SOLN
25.0000 ug | INTRAMUSCULAR | Status: DC | PRN
Start: 2017-05-03 — End: 2017-05-03

## 2017-05-03 MED ORDER — NALOXONE HCL 0.4 MG/ML IJ SOLN: 0.4000 mg | INTRAMUSCULAR | Status: DC | PRN

## 2017-05-03 MED ORDER — DIPHENHYDRAMINE HCL 25 MG PO CAPS: 25.0000 mg | ORAL_CAPSULE | Freq: Four times a day (QID) | ORAL | Status: DC | PRN

## 2017-05-03 MED ORDER — KETOROLAC TROMETHAMINE 30 MG/ML IJ SOLN
30.0000 mg | Freq: Four times a day (QID) | INTRAMUSCULAR | Status: DC | PRN
Start: 2017-05-03 — End: 2017-05-03

## 2017-05-03 MED ORDER — OXYCODONE-ACETAMINOPHEN 5-325 MG PO TABS: 1.0000 | ORAL_TABLET | ORAL | Status: DC | PRN

## 2017-05-03 MED ORDER — LACTATED RINGERS IV SOLN
INTRAVENOUS | Status: DC
  Administered 2017-05-03: 19:00:00 via INTRAVENOUS

## 2017-05-03 MED ORDER — COCONUT OIL OIL
1.0000 "application " | TOPICAL_OIL | Status: DC | PRN
  Administered 2017-05-04: 1 via TOPICAL
  Filled 2017-05-03: qty 120

## 2017-05-03 MED ORDER — SIMETHICONE 80 MG PO CHEW
80.0000 mg | CHEWABLE_TABLET | Freq: Three times a day (TID) | ORAL | Status: DC
  Administered 2017-05-03 – 2017-05-05 (×5): 80 mg via ORAL
  Filled 2017-05-03 (×6): qty 1

## 2017-05-03 MED ORDER — ACETAMINOPHEN 325 MG PO TABS
650.0000 mg | ORAL_TABLET | ORAL | Status: DC | PRN
  Administered 2017-05-04 (×2): 650 mg via ORAL
  Filled 2017-05-03 (×2): qty 2

## 2017-05-03 MED ORDER — LACTATED RINGERS IV SOLN
INTRAVENOUS | Status: DC | PRN
  Administered 2017-05-03: 11:00:00 via INTRAVENOUS

## 2017-05-03 MED ORDER — NALOXONE HCL 4 MG/10ML IJ SOLN
1.0000 ug/kg/h | INTRAVENOUS | Status: DC | PRN
  Filled 2017-05-03: qty 5

## 2017-05-03 MED ORDER — ONDANSETRON HCL 4 MG/2ML IJ SOLN
INTRAMUSCULAR | Status: AC
  Filled 2017-05-03: qty 2

## 2017-05-03 MED ORDER — DIPHENHYDRAMINE HCL 50 MG/ML IJ SOLN: 12.5000 mg | INTRAMUSCULAR | Status: DC | PRN

## 2017-05-03 MED ORDER — MORPHINE SULFATE (PF) 2 MG/ML IV SOLN: 1.0000 mg | INTRAVENOUS | Status: DC | PRN

## 2017-05-03 MED ORDER — MENTHOL 3 MG MT LOZG
1.0000 | LOZENGE | OROMUCOSAL | Status: DC | PRN
  Filled 2017-05-03: qty 9

## 2017-05-03 MED ORDER — PHENYLEPHRINE HCL 10 MG/ML IJ SOLN
INTRAMUSCULAR | Status: AC
  Filled 2017-05-03: qty 1

## 2017-05-03 MED ORDER — OXYTOCIN 40 UNITS IN LACTATED RINGERS INFUSION - SIMPLE MED: 2.5000 [IU]/h | INTRAVENOUS | Status: AC

## 2017-05-03 MED ORDER — BUPIVACAINE HCL (PF) 0.5 % IJ SOLN
INTRAMUSCULAR | Status: DC | PRN
  Administered 2017-05-03: 20 mL

## 2017-05-03 MED ORDER — TERBUTALINE SULFATE 1 MG/ML IJ SOLN: 0.2500 mg | Freq: Once | INTRAMUSCULAR | Status: DC | PRN

## 2017-05-03 MED ORDER — BUPIVACAINE HCL (PF) 0.5 % IJ SOLN
INTRAMUSCULAR | Status: AC
  Filled 2017-05-03: qty 30

## 2017-05-03 MED ORDER — ACETAMINOPHEN 500 MG PO TABS
1000.0000 mg | ORAL_TABLET | Freq: Four times a day (QID) | ORAL | Status: DC
  Administered 2017-05-03 (×2): 1000 mg via ORAL
  Filled 2017-05-03 (×2): qty 2

## 2017-05-03 MED ORDER — MEPERIDINE HCL 25 MG/ML IJ SOLN: 6.2500 mg | INTRAMUSCULAR | Status: DC | PRN

## 2017-05-03 MED ORDER — KETOROLAC TROMETHAMINE 30 MG/ML IJ SOLN
30.0000 mg | Freq: Four times a day (QID) | INTRAMUSCULAR | Status: AC
  Administered 2017-05-03 – 2017-05-04 (×4): 30 mg via INTRAVENOUS
  Filled 2017-05-03 (×4): qty 1

## 2017-05-03 MED ORDER — PROPOFOL 10 MG/ML IV BOLUS
INTRAVENOUS | Status: AC
  Filled 2017-05-03: qty 20

## 2017-05-03 MED ORDER — OXYTOCIN 40 UNITS IN LACTATED RINGERS INFUSION - SIMPLE MED
1.0000 m[IU]/min | INTRAVENOUS | Status: DC
  Administered 2017-05-03: 1 m[IU]/min via INTRAVENOUS

## 2017-05-03 MED ORDER — SUCCINYLCHOLINE CHLORIDE 20 MG/ML IJ SOLN
INTRAMUSCULAR | Status: AC
  Filled 2017-05-03: qty 1

## 2017-05-03 MED ORDER — SODIUM CHLORIDE 0.9 % IJ SOLN
INTRAMUSCULAR | Status: AC
  Administered 2017-05-03: 100 mL
  Filled 2017-05-03: qty 50

## 2017-05-03 MED ORDER — DIPHENHYDRAMINE HCL 25 MG PO CAPS
25.0000 mg | ORAL_CAPSULE | ORAL | Status: DC | PRN
Start: 2017-05-03 — End: 2017-05-03

## 2017-05-03 MED ORDER — SIMETHICONE 80 MG PO CHEW
80.0000 mg | CHEWABLE_TABLET | ORAL | Status: DC
  Administered 2017-05-03 – 2017-05-04 (×2): 80 mg via ORAL
  Filled 2017-05-03 (×2): qty 1

## 2017-05-03 MED ORDER — OXYCODONE-ACETAMINOPHEN 5-325 MG PO TABS: 2.0000 | ORAL_TABLET | ORAL | Status: DC | PRN

## 2017-05-03 MED ORDER — PHENYLEPHRINE 40 MCG/ML (10ML) SYRINGE FOR IV PUSH (FOR BLOOD PRESSURE SUPPORT)
PREFILLED_SYRINGE | INTRAVENOUS | Status: DC | PRN
  Administered 2017-05-03: 200 ug via INTRAVENOUS

## 2017-05-03 MED ORDER — ONDANSETRON HCL 4 MG/2ML IJ SOLN
INTRAMUSCULAR | Status: DC | PRN
  Administered 2017-05-03: 4 mg via INTRAVENOUS

## 2017-05-03 MED ORDER — ONDANSETRON HCL 4 MG/2ML IJ SOLN: 4.0000 mg | Freq: Once | INTRAMUSCULAR | Status: DC | PRN

## 2017-05-03 MED ORDER — SENNOSIDES-DOCUSATE SODIUM 8.6-50 MG PO TABS
2.0000 | ORAL_TABLET | ORAL | Status: DC
  Administered 2017-05-03 – 2017-05-04 (×2): 2 via ORAL
  Filled 2017-05-03 (×2): qty 2

## 2017-05-03 MED ORDER — BUPIVACAINE IN DEXTROSE 0.75-8.25 % IT SOLN
INTRATHECAL | Status: DC | PRN
  Administered 2017-05-03: 1.6 mL via INTRATHECAL

## 2017-05-03 SURGICAL SUPPLY — 23 items
CANISTER SUCT 3000ML PPV (MISCELLANEOUS) ×3 IMPLANT
CATH KIT ON-Q SILVERSOAK 5IN (CATHETERS) ×6 IMPLANT
CHLORAPREP W/TINT 26ML (MISCELLANEOUS) ×6 IMPLANT
DERMABOND ADVANCED (GAUZE/BANDAGES/DRESSINGS) ×2
DERMABOND ADVANCED .7 DNX12 (GAUZE/BANDAGES/DRESSINGS) ×1 IMPLANT
DRSG OPSITE POSTOP 4X10 (GAUZE/BANDAGES/DRESSINGS) ×6 IMPLANT
ELECT CAUTERY BLADE 6.4 (BLADE) ×3 IMPLANT
ELECT REM PT RETURN 9FT ADLT (ELECTROSURGICAL) ×3
ELECTRODE REM PT RTRN 9FT ADLT (ELECTROSURGICAL) ×1 IMPLANT
GLOVE SKINSENSE NS SZ8.0 LF (GLOVE) ×10
GLOVE SKINSENSE STRL SZ8.0 LF (GLOVE) ×5 IMPLANT
GOWN STRL REUS W/ TWL LRG LVL3 (GOWN DISPOSABLE) ×1 IMPLANT
GOWN STRL REUS W/ TWL XL LVL3 (GOWN DISPOSABLE) ×2 IMPLANT
GOWN STRL REUS W/TWL LRG LVL3 (GOWN DISPOSABLE) ×2
GOWN STRL REUS W/TWL XL LVL3 (GOWN DISPOSABLE) ×4
NS IRRIG 1000ML POUR BTL (IV SOLUTION) ×3 IMPLANT
PACK C SECTION AR (MISCELLANEOUS) ×3 IMPLANT
PAD OB MATERNITY 4.3X12.25 (PERSONAL CARE ITEMS) ×3 IMPLANT
PAD PREP 24X41 OB/GYN DISP (PERSONAL CARE ITEMS) ×3 IMPLANT
SUT MAXON ABS #0 GS21 30IN (SUTURE) ×6 IMPLANT
SUT VIC AB 1 CT1 36 (SUTURE) ×9 IMPLANT
SUT VIC AB 2-0 CT1 36 (SUTURE) ×3 IMPLANT
SUT VIC AB 4-0 FS2 27 (SUTURE) ×3 IMPLANT

## 2017-05-03 NOTE — Op Note (Signed)
Cesarean Section Procedure Note Indications: Fetal intolerence to labor and term intrauterine pregnancy  Pre-operative Diagnosis: Intrauterine pregnancy [redacted]w[redacted]d ;  FITL and term intrauterine pregnancy Post-operative Diagnosis: same, delivered. Procedure: Low Transverse Cesarean Section Surgeon: [name], MD, FACOG Assistant(s): A Ore Anesthesia: Spinal anesthesia Estimated Blood [id number] Complications: None; patient tolerated the procedure well. Disposition: PACU - hemodynamically stable. Condition: stable  Findings: A female infant in the cephalic presentation. Amniotic fluid - Clear  Birth weight 5-11 lbs.  Apgars of 8 and 9.  Intact placenta with a three-vessel cord. Grossly normal uterus, tubes and ovaries bilaterally. No intraabdominal adhesions were noted.  Procedure Details   The patient was taken to Operating Room, identified as the correct patient and the procedure verified as C-Section Delivery. A Time Out was held and the above information confirmed. After induction of anesthesia, the patient was draped and prepped in the usual sterile manner. A Pfannenstiel incision was made and carried down through the subcutaneous tissue to the fascia. Fascial incision was made and extended transversely with the Mayo scissors. The fascia was separated from the underlying rectus tissue superiorly and inferiorly. The peritoneum was identified and entered bluntly. Peritoneal incision was extended longitudinally. The utero-vesical peritoneal reflection was incised transversely and a bladder flap was created digitally.  A low transverse hysterotomy was made. The fetus was delivered atraumatically. The umbilical cord was clamped x2 and cut and the infant was handed to the awaiting pediatricians. The placenta was removed intact and appeared normal with a 3-vessel cord.  The uterus was exteriorized and cleared of all clot and debris. The hysterotomy was closed with running sutures of 0 Vicryl suture.  A second imbricating layer was placed with the same suture. Excellent hemostasis was observed. The uterus was returned to the abdomen. The pelvis was irrigated and again, excellent hemostasis was noted.  The On Q Pain pump System was then placed.  Trocars were placed through the abdominal wall into the subfascial space and these were used to thread the silver soaker cathaters into place.The rectus fascia was then reapproximated with running sutures of [place], with careful placement not to incorporate the cathaters. Subcutaneous tissues are then irrigated with saline and hemostasis assured.  Skin is then closed with 4-0 vicryl suture in a subcuticular fashion followed by skin adhesive. The cathaters are flushed each with 5 mL of Bupivicaine and stabilized into place with dressing. Instrument, sponge, and needle counts were correct prior to the abdominal closure and at the conclusion of the case.  The patient tolerated the procedure well and was transferred to the recovery room in stable condition.   [name], MD, [facility], [facility] 05/03/2017  11:43 AM

## 2017-05-03 NOTE — Progress Notes (Signed)
  Labor Progress Note   Subjective:  More decls noted on monitor w ctxs (late type) Min pain w ctxs on Pitocin 2 mU/min  Objective:  BP 125/86   Pulse 72   Temp 97.9 F (36.6 C) (Oral)   Resp 16   Ht 5\' 6"  (1.676 m)   Wt 174 lb (78.9 kg)   LMP 08/15/2016 (Exact Date)   BMI 28.08 kg/m  Abd: mild Extr: trace to 1+ bilateral pedal edema SVE: deferred  EFM: FHR: 140 bpm, variability: minimal ,  accelerations:  Abscent,  decelerations:  Present late type decels w some ctxs (>50% ctxs for last 30+ min) Toco: Frequency: Every 3 minutes Labs: I have reviewed the patient's lab results.   Assessment & Plan:  [id number] @ [redacted]w[redacted]d, admitted for  Pregnancy and Labor/Delivery Management  1. Pain management: none. 2. FWB: FHT category 3.  3. ID: GBS negative 4. Labor management: Plan CS now for FITL/ Pitocin stopped/ Consent obtained  The risks of cesarean section discussed with the patient included but were not limited to: bleeding which may require transfusion or reoperation; infection which may require antibiotics; injury to bowel, bladder, ureters or other surrounding organs; injury to the fetus; need for additional procedures including hysterectomy in the event of a life-threatening hemorrhage; placental abnormalities wth subsequent pregnancies, incisional problems, thromboembolic phenomenon and other postoperative/anesthesia complications. The patient concurred with the proposed plan, giving informed written consent for the procedure.   All discussed with patient, see orders  [name], MD, [facility], [facility] 05/03/2017  10:09 AM

## 2017-05-03 NOTE — Anesthesia Post-op Follow-up Note (Signed)
Anesthesia QCDR form completed.        

## 2017-05-03 NOTE — Progress Notes (Signed)
  Labor Progress Note   42 y.o. [id number] @ [redacted]w[redacted]d , admitted for  Pregnancy, Labor Management. Induction of Labor for Gestational Hypertension  Subjective:  Sleeping, has been rating contractions 3/10 while awake.  Objective:  BP (!) 115/59   Pulse 71   Temp 98.6 F (37 C) (Oral)   Resp 16   Ht 5\' 6"  (1.676 m)   Wt 176 lb (79.8 kg)   LMP 08/15/2016 (Exact Date)   BMI 28.41 kg/m  SVE: RN reports cervix difficult to reach, recheck with next Cytotec placement  EFM: FHR: 145 bpm, variability: moderate,  accelerations:  Present,  decelerations:  Present occasional late Toco: Intensity: mild, every 1-6 minutes lasting 40-60 sec. Labs: I have reviewed the patient's lab results.   Assessment & Plan:  [id number] @ [redacted]w[redacted]d, admitted for  Pregnancy and Labor/Delivery Management, IOL for gHTN  1. Pain management:  IV sedation, epidural when ready. 2. FWB: some occasional late decelerations that improve with repositioning, continue to montior  3. ID: GBS negative  4. BP normotensive to mild range  5. Labor management: continue Cytotec when decelerations resolved, plan for Foley bulb placement if cervix at least 1 cm after next dose.  Avel Sensor, CNM 05/03/2017  1:58 AM

## 2017-05-03 NOTE — Discharge Instructions (Signed)

## 2017-05-03 NOTE — Progress Notes (Signed)
EFM now Category I, late decelerations have resolved. Baseline 145, moderate variability, accelerations present, decelerations, none.   Attempted to place foley bulb with speculum visualization. Unable to advance past external os.  Cervical exam: 0.5 eternal os but closed at internal os/60/-3. Placed Cytotec. Continue to monitor for cervical change.  Avel Sensor, CNM 05/03/2017  3:35 AM

## 2017-05-03 NOTE — Progress Notes (Signed)
  Labor Progress Note   42 y.o. U2P5361 @ [redacted]w[redacted]d , admitted for  Pregnancy, Labor Management.   Subjective:  Min pain. IOL for Gest HTN.  Has received Cytotec x6 doses.   Objective:  BP 131/77   Pulse 70   Temp 97.9 F (36.6 C) (Oral)   Resp 16   Ht 5\' 6"  (1.676 m)   Wt 174 lb (78.9 kg)   LMP 08/15/2016 (Exact Date)   BMI 28.08 kg/m  Occas elevated BP, no severe range BPs Abd: mild Extr: trace to 1+ bilateral pedal edema SVE: FT/70/-3 (unchanged)  EFM: FHR: 140 bpm, variability: moderate,  accelerations:  Present,  decelerations:  Absent except for some occas slight decels from baseline following certain ctxs Toco: Frequency: Every 5 minutes Labs: I have reviewed the patient's lab results.  Assessment & Plan:  [id number] @ [redacted]w[redacted]d, admitted for Gest HTN; Pregnancy and Labor/Delivery Management  1. Pain management: none. 2. FWB: FHT category 2 (still has variability and late decels are not with every ctx) 3. ID: GBS negative 4. Labor management: No cervical response to Cytotec as of yet.  Will start Pitocin and see if responds. Not amenable to cervical bulb ripening techniques. Monitor FHTs, as she has had occasions of slight decels w some ctxs.  Pt counseled that if these worsen in frequency or degree of drop from baseline, then a CS would be necessary, as there is still a long way to go in dilation/labor and must ensure fetal tolerence.  All discussed with patient, see orders  [name], MD, [facility], [facility] 05/03/2017  8:42 AM

## 2017-05-03 NOTE — Anesthesia Preprocedure Evaluation (Signed)
Anesthesia Evaluation  Patient identified by MRN, date of birth, ID band Patient awake    Reviewed: Allergy & Precautions, NPO status , Patient's Chart, lab work & pertinent test results  History of Anesthesia Complications Negative for: history of anesthetic complications  Airway Mallampati: II       Dental   Pulmonary neg sleep apnea, neg COPD,           Cardiovascular hypertension (gestational), (-) Past MI and (-) CHF (-) dysrhythmias (-) Valvular Problems/Murmurs     Neuro/Psych neg Seizures    GI/Hepatic Neg liver ROS, GERD (with pregnancy)  ,  Endo/Other  neg diabetes  Renal/GU negative Renal ROS     Musculoskeletal   Abdominal   Peds  Hematology   Anesthesia Other Findings   Reproductive/Obstetrics (+) Pregnancy                             Anesthesia Physical Anesthesia Plan  ASA: II and emergent  Anesthesia Plan: Spinal   Post-op Pain Management:    Induction:   PONV Risk Score and Plan:   Airway Management Planned:   Additional Equipment:   Intra-op Plan:   Post-operative Plan:   Informed Consent: I have reviewed the patients History and Physical, chart, labs and discussed the procedure including the risks, benefits and alternatives for the proposed anesthesia with the patient or authorized representative who has indicated his/her understanding and acceptance.     Plan Discussed with:   Anesthesia Plan Comments:         Anesthesia Quick Evaluation

## 2017-05-03 NOTE — Anesthesia Procedure Notes (Signed)
Date/Time: 05/03/2017 10:48 AM Performed by: Doreen Salvage, CRNA Pre-anesthesia Checklist: Patient identified, Emergency Drugs available, Suction available and Patient being monitored Patient Re-evaluated:Patient Re-evaluated prior to induction Oxygen Delivery Method: Nasal cannula Induction Type: IV induction Dental Injury: Teeth and Oropharynx as per pre-operative assessment  Comments: Nasal cannula with etCO2 monitoring

## 2017-05-03 NOTE — Transfer of Care (Signed)
Immediate Anesthesia Transfer of Care Note  Patient: Kristine Arroyo  Procedure(s) Performed: Procedure(s): CESAREAN SECTION (N/A)  Patient Location: PACU  Anesthesia Type:Spinal  Level of Consciousness: awake, alert  and oriented  Airway & Oxygen Therapy: Patient Spontanous Breathing and Patient connected to face mask oxygen  Post-op Assessment: Report given to RN and Post -op Vital signs reviewed and stable  Post vital signs: Reviewed and stable  Last Vitals:  Vitals:   05/03/17 0924 05/03/17 1155  BP: 125/86 113/68  Pulse: 72   Resp:  10  Temp:  36.5 C  SpO2:  63%    Complications: No apparent anesthesia complications

## 2017-05-04 LAB — CBC
HEMATOCRIT: 30.1 % — AB (ref 35.0–47.0)
HEMOGLOBIN: 10 g/dL — AB (ref 12.0–16.0)
MCH: 29.4 pg (ref 26.0–34.0)
MCHC: 33.4 g/dL (ref 32.0–36.0)
MCV: 88.1 fL (ref 80.0–100.0)
Platelets: 138 10*3/uL — ABNORMAL LOW (ref 150–440)
RBC: 3.42 MIL/uL — ABNORMAL LOW (ref 3.80–5.20)
RDW: 15.2 % — ABNORMAL HIGH (ref 11.5–14.5)
WBC: 10.4 10*3/uL (ref 3.6–11.0)

## 2017-05-04 MED ORDER — HYDROCODONE-ACETAMINOPHEN 5-325 MG PO TABS
1.0000 | ORAL_TABLET | ORAL | Status: DC | PRN
  Administered 2017-05-04 – 2017-05-05 (×2): 1 via ORAL
  Filled 2017-05-04 (×2): qty 1

## 2017-05-04 MED ORDER — IBUPROFEN 800 MG PO TABS
400.0000 mg | ORAL_TABLET | Freq: Four times a day (QID) | ORAL | Status: DC | PRN
  Administered 2017-05-04 – 2017-05-05 (×2): 400 mg via ORAL
  Filled 2017-05-04 (×2): qty 1

## 2017-05-04 NOTE — Anesthesia Post-op Follow-up Note (Signed)
  Anesthesia Pain Follow-up Note  Patient: Kristine Arroyo  Day #: 1  Date of Follow-up: 05/04/2017 Time: 2:12 PM  Last Vitals:  Vitals:   05/04/17 0423 05/04/17 0911  BP: 121/78 132/85  Pulse: 70 67  Resp: 18 18  Temp: 36.6 C 36.6 C  SpO2: 100% 99%    Level of Consciousness: alert  Pain: none   Side Effects:None  Catheter Site Exam:clean, dry, no drainage     Plan: D/C from anesthesia care at surgeon's request  Doyle

## 2017-05-04 NOTE — Anesthesia Postprocedure Evaluation (Signed)
Anesthesia Post Note  Patient: Kristine Arroyo  Procedure(s) Performed: CESAREAN SECTION (N/A )  Patient location during evaluation: Mother Baby Anesthesia Type: Spinal Level of consciousness: awake and alert Pain management: pain level controlled Vital Signs Assessment: post-procedure vital signs reviewed and stable Respiratory status: spontaneous breathing, nonlabored ventilation and respiratory function stable Cardiovascular status: stable Postop Assessment: no headache and no backache Anesthetic complications: no     Last Vitals:  Vitals:   05/04/17 0423 05/04/17 0911  BP: 121/78 132/85  Pulse: 70 67  Resp: 18 18  Temp: 36.6 C 36.6 C  SpO2: 100% 99%    Last Pain:  Vitals:   05/04/17 1354  TempSrc:   PainSc: 0-No pain                 Jamian Andujo K

## 2017-05-04 NOTE — Progress Notes (Signed)
Admit Date: 05/01/2017 Today's Date: 05/04/2017  Subjective: Postpartum Day 1: Cesarean Delivery Patient reports tolerating PO, + flatus and no problems voiding.    Objective: Vital signs in last 24 hours: Temp:  [97.7 F (36.5 C)-98.6 F (37 C)] 97.8 F (36.6 C) (03/03 0423) Pulse Rate:  [59-84] 70 (03/03 0423) Resp:  [6-27] 18 (03/03 0423) BP: (105-150)/(66-90) 121/78 (03/03 0423) SpO2:  [97 %-100 %] 100 % (03/03 0423)  Physical Exam:  General: alert, cooperative and no distress Lochia: appropriate Uterine Fundus: firm Incision: healing well, no significant drainage, no dehiscence, no significant erythema DVT Evaluation: No evidence of DVT seen on physical exam. Negative Homan's sign. No cords or calf tenderness. No significant calf/ankle edema.  Recent Labs    05/03/17 1022 05/04/17 0553  HGB 12.1 10.0*  HCT 36.2 30.1*    Assessment/Plan: Status post Cesarean section. Doing well postoperatively.  Continue current care. Breast feeding Plans IUD Labetalol 200 BID for gest HTN that continued intitially in pp timeframe.   Denies ha, blurry vision, epig pain, CP, SOB, edema.   Probable discharge on this medicine w later taper off PNV and iron for mild anemia  Kristine Arroyo 05/04/2017, 9:05 AM

## 2017-05-05 ENCOUNTER — Encounter: Payer: Self-pay | Admitting: Obstetrics & Gynecology

## 2017-05-05 MED ORDER — HYDROCODONE-ACETAMINOPHEN 5-325 MG PO TABS: 1.0000 | ORAL_TABLET | Freq: Four times a day (QID) | ORAL | 0 refills | Status: DC | PRN

## 2017-05-05 MED ORDER — LABETALOL HCL 200 MG PO TABS: 200.0000 mg | ORAL_TABLET | Freq: Two times a day (BID) | ORAL | 1 refills | Status: DC

## 2017-05-05 MED ORDER — IBUPROFEN 600 MG PO TABS: 600.0000 mg | ORAL_TABLET | Freq: Four times a day (QID) | ORAL | 1 refills | Status: DC | PRN

## 2017-05-05 NOTE — Plan of Care (Signed)
Afeb. VSS. Color good, skin w&d. Up ambulating independently with steady gait. Lower abdominal incision with Honeycomb Dressing in place and without s/s complications. Stated effective Pain control with On Q pump and PRN Vicodin.

## 2017-05-05 NOTE — Progress Notes (Signed)
Discharge instructions reviewed with patient and significant other.  Instructions on wound cleansing, follow-up appointment, and signs and symptoms of infection.  Infant safe sleep education given with teach-back from parents.  Acknowledgement of understanding of all education.  Patient and infant discharged via wheelchair to visitor entrance.

## 2017-05-05 NOTE — Discharge Summary (Signed)
Physician Obstetric Discharge Summary  Patient ID: Kristine Arroyo MRN: [id number] DOB/AGE: September 10, 1975 42 y.o.   Date of Admission: 05/01/2017  Date of Discharge: 05/05/2017  Admitting Diagnosis: Induction of labor at [redacted]w[redacted]d  Secondary Diagnosis: Gestational hypertension  Mode of Delivery: primary cesarean section 05/03/2017 low uterine, transverse     Discharge Diagnosis: Term intrauterine pregnancy-delivered, gestational hypertension, fetal intolerance to labor,   Intrapartum Procedures: pitocin augmentation and cytotec induction   Post partum procedures: none  Complications: none   Brief Hospital Course   Kristine Arroyo is a 42 year old G3P0020 who underwent cesarean section on 05/03/2017 for fetal intolerance of labor. She was undergoing an induction of labor for gestational hypertension.  Patient had an uncomplicated surgery; for further details of this surgery, please refer to the operative note.  Patient had an uncomplicated postpartum course.  By time of discharge on POD#2, her pain was controlled on oral pain medications; she had appropriate lochia and was ambulating, voiding without difficulty, tolerating regular diet and passing flatus.  Her blood pressures were in the mild to normal range on labetalol 200 mgm BID.  She was deemed stable for discharge to home.    Labs: CBC Latest Ref Rng & Units 05/04/2017 05/03/2017 05/01/2017  WBC 3.6 - 11.0 K/uL 10.4 12.7(H) 10.8  Hemoglobin 12.0 - 16.0 g/dL 10.0(L) 12.1 11.9(L)  Hematocrit 35.0 - 47.0 % 30.1(L) 36.2 34.8(L)  Platelets 150 - 440 K/uL 138(L) 202 204   O POS/ RI/ VI  Physical exam:  Blood pressure 136/89, pulse 73, temperature 97.8 F (36.6 C), temperature source Oral, resp. rate 20, height 5\' 6"  (1.676 m), weight 78.9 kg (174 lb), last menstrual period 08/15/2016, SpO2 99 %. General: alert and no distress, passing flatus, denies lightheadedness when OOB Heart: RRR without murmur Lungs: CTAB Lochia: appropriate Abdomen: soft,  NT, BS active Uterine Fundus: firm/ ML/NT at U-3 Incision: Honey comb dressing C+D+I, ON Q intact Extremities: No evidence of DVT seen on physical exam. No lower extremity edema.  Discharge Instructions: Per After Visit Summary. Activity: Advance as tolerated. Pelvic rest for 6 weeks.  Also refer to Discharge Instructions Diet: Regular Medications: Allergies as of 05/05/2017      Reactions   Gluten Meal Nausea And Vomiting   Compares to food poisoning      Medication List    STOP taking these medications   cyclobenzaprine 10 MG tablet Commonly known as:  FLEXERIL   folic acid 1 MG tablet Commonly known as:  FOLVITE     TAKE these medications   HYDROcodone-acetaminophen 5-325 MG tablet Commonly known as:  NORCO/VICODIN Take 1-2 tablets by mouth every 6 (six) hours as needed for severe pain. What changed:    how much to take  reasons to take this   ibuprofen 600 MG tablet Commonly known as:  ADVIL,MOTRIN Take 1 tablet (600 mg total) by mouth every 6 (six) hours as needed for headache, mild pain, moderate pain or cramping.   labetalol 200 MG tablet Commonly known as:  NORMODYNE Take 1 tablet (200 mg total) by mouth 2 (two) times daily.   prenatal multivitamin Tabs tablet Take 1 tablet by mouth daily at 12 noon.      Outpatient follow up:  Follow-up Information    Gae Dry, MD. Schedule an appointment as soon as possible for a visit in 1 week(s).   Specialty:  Obstetrics and Gynecology Why:  for blood pressure check Contact information: [address] [place] [address] [phone]  Postpartum contraception: condoms or minipill  Discharged Condition: stable  Discharged to: home   Newborn Data: Hannah/ 5#11oz Disposition:home with mother  Apgars: APGAR (1 MIN): 8   APGAR (5 MINS): 9   APGAR (10 MINS):    Baby Feeding: Breast  Dalia Heading, [place] 05/05/2017 9:32 AM

## 2017-05-08 ENCOUNTER — Encounter: Payer: 59 | Admitting: Certified Nurse Midwife

## 2017-05-08 ENCOUNTER — Encounter: Payer: Self-pay | Admitting: Obstetrics & Gynecology

## 2017-05-08 ENCOUNTER — Other Ambulatory Visit: Payer: 59

## 2017-05-08 NOTE — Telephone Encounter (Signed)
Please advise 

## 2017-05-12 ENCOUNTER — Ambulatory Visit (INDEPENDENT_AMBULATORY_CARE_PROVIDER_SITE_OTHER): Payer: 59 | Admitting: Obstetrics & Gynecology

## 2017-05-12 ENCOUNTER — Encounter: Payer: Self-pay | Admitting: Obstetrics & Gynecology

## 2017-05-12 NOTE — Progress Notes (Signed)
  Postoperative Follow-up Patient presents post op from CS for FTP, 1 week ago.  Subjective: Patient reports marked improvement in her preop symptoms. Eating a regular diet without difficulty. Pain is controlled with current analgesics. Medications being used: ibuprofen (OTC) and narcotic analgesics including Norco.  Activity: normal activities of daily living. Patient reports vaginal sx's of None  Objective: BP 108/74 (BP Location: Right Arm, Patient Position: Sitting, Cuff Size: Normal)   Pulse 83   Ht 5\' 6"  (1.676 m)   Wt 156 lb (70.8 kg)   LMP 08/15/2016 (Exact Date)   BMI 25.18 kg/m  Physical Exam  Constitutional: She is oriented to person, place, and time. She appears well-developed and well-nourished. No distress.  Cardiovascular: Normal rate.  Pulmonary/Chest: Effort normal.  Abdominal: Soft. She exhibits no distension. There is no tenderness.  Incision Healing Well   Musculoskeletal: Normal range of motion.  Neurological: She is alert and oriented to person, place, and time. No cranial nerve deficit.  Skin: Skin is warm and dry.  Psychiatric: She has a normal mood and affect.    Assessment: s/p :  cesarean section stable  Plan: Patient has done well after surgery with no apparent complications.  I have discussed the post-operative course to date, and the expected progress moving forward.  The patient understands what complications to be concerned about.  I will see the patient in routine follow up, or sooner if needed.    Activity plan: No heavy lifting.  Plans NorQD  HTN resolved, stop Labetalol, sx's to watch for regarding relapse discussed.  [name] 05/12/2017, 9:21 AM

## 2017-06-16 ENCOUNTER — Encounter: Payer: Self-pay | Admitting: Obstetrics & Gynecology

## 2017-06-16 ENCOUNTER — Ambulatory Visit (INDEPENDENT_AMBULATORY_CARE_PROVIDER_SITE_OTHER): Payer: 59 | Admitting: Obstetrics & Gynecology

## 2017-06-16 DIAGNOSIS — Z124 Encounter for screening for malignant neoplasm of cervix: Secondary | ICD-10-CM

## 2017-06-16 MED ORDER — NORETHINDRONE 0.35 MG PO TABS
1.0000 | ORAL_TABLET | Freq: Every day | ORAL | 11 refills | Status: DC
Start: 2017-06-16 — End: 2017-08-25

## 2017-06-16 NOTE — Progress Notes (Signed)
  OBSTETRICS POSTPARTUM CLINIC PROGRESS NOTE  Subjective:     Kristine Arroyo is a 42 y.o. G23P1021 female who presents for a postpartum visit. She is 6 weeks postpartum following a Term pregnancy and delivery by C-section failure to progress.  I have fully reviewed the prenatal and intrapartum course. Anesthesia: spinal.  Postpartum course has been complicated by uncomplicated.  Baby is feeding by Bottle and Breast.  Bleeding: patient has not  resumed menses.  Bowel function is normal. Bladder function is normal.  Patient is not sexually active. Contraception method desired is oral progesterone-only contraceptive.  Postpartum depression screening: negative. Edinburgh 3.  The following portions of the patient's history were reviewed and updated as appropriate: allergies, current medications, past family history, past medical history, past social history, past surgical history and problem list.  Review of Systems Pertinent items noted in HPI and remainder of comprehensive ROS otherwise negative.  Objective:    BP 120/80   Pulse 75   Ht 5\' 6"  (1.676 m)   Wt 159 lb (72.1 kg)   LMP 08/15/2016 (Exact Date)   Breastfeeding? Unknown   BMI 25.66 kg/m   General:  alert and no distress   Breasts:  inspection negative, no nipple discharge or bleeding, no masses or nodularity palpable  Lungs: clear to auscultation bilaterally  Heart:  regular rate and rhythm, S1, S2 normal, no murmur, click, rub or gallop  Abdomen: soft, non-tender; bowel sounds normal; no masses,  no organomegaly.  Well healed Pfannenstiel incision   Vulva:  normal  Vagina: normal vagina, no discharge, exudate, lesion, or erythema  Cervix:  no cervical motion tenderness and no lesions  Corpus: normal size, contour, position, consistency, mobility, non-tender  Adnexa:  normal adnexa and no mass, fullness, tenderness  Rectal Exam: Not performed.        Assessment:  Post Partum Care visit 1. Postpartum care following  cesarean delivery - Plans pregnancy again in future. Counseled to wait 18 mos to try.  AMA risks discussed.  2. Screening for cervical cancer - IGP, Aptima HPV  Plan:  See orders and Patient Instructions Contraceptive counseling for oral progesterone-only contraceptive Resume all normal activities Follow up in: 12 months or as needed.  MMG deferred due to breast feeding ad she has had prior MMG last year.  [name], MD, [facility], [facility] 06/16/2017  8:58 AM

## 2017-06-16 NOTE — Patient Instructions (Signed)
Norethindrone tablets (contraception) What is this medicine? NORETHINDRONE (nor eth IN drone) is an oral contraceptive. The product contains a female hormone known as a progestin. It is used to prevent pregnancy. This medicine may be used for other purposes; ask your health care provider or pharmacist if you have questions. COMMON BRAND NAME(S): Camila, Deblitane 28-Day, Errin, Heather, [place], Jolivette, [place], Nor-QD, Nora-BE, Norlyroc, Ortho Micronor, [employer] 28-Day What should I tell my health care provider before I take this medicine? They need to know if you have any of these conditions: -blood vessel disease or blood clots -breast, cervical, or vaginal cancer -diabetes -heart disease -kidney disease -liver disease -mental depression -migraine -seizures -stroke -vaginal bleeding -an unusual or allergic reaction to norethindrone, other medicines, foods, dyes, or preservatives -pregnant or trying to get pregnant -breast-feeding How should I use this medicine? Take this medicine by mouth with a glass of water. You may take it with or without food. Follow the directions on the prescription label. Take this medicine at the same time each day and in the order directed on the package. Do not take your medicine more often than directed. Contact your pediatrician regarding the use of this medicine in children. Special care may be needed. This medicine has been used in female children who have started having menstrual periods. A patient package insert for the product will be given with each prescription and refill. Read this sheet carefully each time. The sheet may change frequently. Overdosage: If you think you have taken too much of this medicine contact a poison control center or emergency room at once. NOTE: This medicine is only for you. Do not share this medicine with others. What if I miss a dose? Try not to miss a dose. Every time you miss a dose or take a dose late your chance of  pregnancy increases. When 1 pill is missed (even if only 3 hours late), take the missed pill as soon as possible and continue taking a pill each day at the regular time (use a back up method of birth control for the next 48 hours). If more than 1 dose is missed, use an additional birth control method for the rest of your pill pack until menses occurs. Contact your health care professional if more than 1 dose has been missed. What may interact with this medicine? Do not take this medicine with any of the following medications: -amprenavir or fosamprenavir -bosentan This medicine may also interact with the following medications: -antibiotics or medicines for infections, especially rifampin, rifabutin, rifapentine, and griseofulvin, and possibly penicillins or tetracyclines -aprepitant -barbiturate medicines, such as phenobarbital -carbamazepine -felbamate -modafinil -oxcarbazepine -phenytoin -ritonavir or other medicines for HIV infection or AIDS -St. John's wort -topiramate This list may not describe all possible interactions. Give your health care provider a list of all the medicines, herbs, non-prescription drugs, or dietary supplements you use. Also tell them if you smoke, drink alcohol, or use illegal drugs. Some items may interact with your medicine. What should I watch for while using this medicine? Visit your doctor or health care professional for regular checks on your progress. You will need a regular breast and pelvic exam and Pap smear while on this medicine. Use an additional method of birth control during the first cycle that you take these tablets. If you have any reason to think you are pregnant, stop taking this medicine right away and contact your doctor or health care professional. If you are taking this medicine for hormone related problems, it  may take several cycles of use to see improvement in your condition. This medicine does not protect you against HIV infection (AIDS)  or any other sexually transmitted diseases. What side effects may I notice from receiving this medicine? Side effects that you should report to your doctor or health care professional as soon as possible: -breast tenderness or discharge -pain in the abdomen, chest, groin or leg -severe headache -skin rash, itching, or hives -sudden shortness of breath -unusually weak or tired -vision or speech problems -yellowing of skin or eyes Side effects that usually do not require medical attention (report to your doctor or health care professional if they continue or are bothersome): -changes in sexual desire -change in menstrual flow -facial hair growth -fluid retention and swelling -headache -irritability -nausea -weight gain or loss This list may not describe all possible side effects. Call your doctor for medical advice about side effects. You may report side effects to FDA at 1-800-FDA-1088. Where should I keep my medicine? Keep out of the reach of children. Store at room temperature between 15 and 30 degrees C (59 and 86 degrees F). Throw away any unused medicine after the expiration date. NOTE: This sheet is a summary. It may not cover all possible information. If you have questions about this medicine, talk to your doctor, pharmacist, or health care provider.  2018 Elsevier/Gold Standard (2011-11-08 16:41:35)  

## 2017-06-18 LAB — IGP, APTIMA HPV
HPV Aptima: NEGATIVE
PAP Smear Comment: 0

## 2017-08-25 ENCOUNTER — Encounter: Payer: Self-pay | Admitting: Obstetrics & Gynecology

## 2017-08-25 ENCOUNTER — Other Ambulatory Visit: Payer: Self-pay | Admitting: Obstetrics and Gynecology

## 2017-08-25 MED ORDER — NORGESTREL-ETHINYL ESTRADIOL 0.3-30 MG-MCG PO TABS: 1.0000 | ORAL_TABLET | Freq: Every day | ORAL | 0 refills | Status: DC

## 2017-08-25 MED ORDER — NORGESTREL-ETHINYL ESTRADIOL 0.3-30 MG-MCG PO TABS: 1.0000 | ORAL_TABLET | Freq: Every day | ORAL | 2 refills | Status: DC

## 2017-08-25 NOTE — Telephone Encounter (Signed)
Can you prescribe this for [facility] since he's not in the office this week?

## 2017-08-25 NOTE — Telephone Encounter (Signed)
Rxs eRxd. RN to notify pt my name on them since [facility] out of town. Thx.

## 2017-08-25 NOTE — Progress Notes (Signed)
Rx change to Lo Ovral from camila, per pt request. 1 Rx to local pharm, other to [employer] order. Done since [facility] out of town.

## 2018-05-21 ENCOUNTER — Other Ambulatory Visit: Payer: Self-pay | Admitting: Obstetrics and Gynecology

## 2018-05-21 MED ORDER — NORGESTREL-ETHINYL ESTRADIOL 0.3-30 MG-MCG PO TABS: 1.0000 | ORAL_TABLET | Freq: Every day | ORAL | 0 refills | Status: DC

## 2018-05-21 NOTE — Telephone Encounter (Signed)
advise

## 2018-05-21 NOTE — Telephone Encounter (Signed)
Called pt to clarify [facility] sent RF this afternoon.

## 2018-05-21 NOTE — Progress Notes (Signed)
Rx RF OCPs until annual

## 2018-06-18 ENCOUNTER — Ambulatory Visit: Payer: 59 | Admitting: Obstetrics and Gynecology

## 2018-08-11 NOTE — Progress Notes (Signed)
PCP:  Patient, No Pcp Per   Chief Complaint  Patient presents with  . Gynecologic Exam    No complaints     HPI:      [name] is a 43 y.o. [id number] who LMP was Patient's last menstrual period was 07/29/2018., presents today for her annual examination.  Her menses are regular every 28-30 days, lasting 3 days.  Dysmenorrhea mild, occurring first 1-2 days of flow. She does not have intermenstrual bleeding.  Sex activity: single partner, contraception - OCP (estrogen/progesterone).  Last Pap: June 16, 2017  Results were: no abnormalities /neg HPV DNA  Hx of STDs: none  Last mammogram: Jul 02, 2016 in [place]. Results were: normal--routine follow-up in 12 months There is no FH of breast cancer. There is no FH of ovarian cancer. The patient does not do self-breast exams.  Tobacco use: The patient denies current or previous tobacco use. Alcohol use: about 5 drinks a week No drug use.  Exercise: moderately active  She does get adequate calcium and Vitamin D in her diet.   Past Medical History:  Diagnosis Date  . Celiac disease     Past Surgical History:  Procedure Laterality Date  . CESAREAN SECTION N/A 05/03/2017   Procedure: CESAREAN SECTION;  Surgeon: Gae Dry, MD;  Location: [facility] ORS;  Service: Obstetrics;  Laterality: N/A;  . HERNIA REPAIR      Family History  Problem Relation Age of Onset  . Cancer Maternal Grandfather   . Cancer Paternal Grandmother     Social History   Socioeconomic History  . Marital status: Married    Spouse name: Not on file  . Number of children: Not on file  . Years of education: Not on file  . Highest education level: Not on file  Occupational History  . Not on file  Social Needs  . Financial resource strain: Not on file  . Food insecurity:    Worry: Not on file    Inability: Not on file  . Transportation needs:    Medical: Not on file    Non-medical: Not on file  Tobacco Use  . Smoking status: Never Smoker  .  Smokeless tobacco: Never Used  Substance and Sexual Activity  . Alcohol use: No  . Drug use: No  . Sexual activity: Yes  Lifestyle  . Physical activity:    Days per week: 0 days    Minutes per session: Not on file  . Stress: Not on file  Relationships  . Social connections:    Talks on phone: Not on file    Gets together: Not on file    Attends religious service: Not on file    Active member of club or organization: Not on file    Attends meetings of clubs or organizations: Not on file    Relationship status: Not on file  . Intimate partner violence:    Fear of current or ex partner: Not on file    Emotionally abused: Not on file    Physically abused: Not on file    Forced sexual activity: Not on file  Other Topics Concern  . Not on file  Social History Narrative  . Not on file    Outpatient Medications Prior to Visit  Medication Sig Dispense Refill  . loratadine (CLARITIN) 10 MG tablet Take 10 mg by mouth daily.    . norgestrel-ethinyl estradiol (LO/OVRAL,CRYSELLE) 0.3-30 MG-MCG tablet Take 1 tablet by mouth daily. 1 Package 0  .  norgestrel-ethinyl estradiol (LO/OVRAL,CRYSELLE) 0.3-30 MG-MCG tablet Take 1 tablet by mouth daily. 3 Package 0  . ibuprofen (ADVIL,MOTRIN) 600 MG tablet Take 1 tablet (600 mg total) by mouth every 6 (six) hours as needed for headache, mild pain, moderate pain or cramping. (Patient not taking: Reported on 06/16/2017) 60 tablet 1  . Prenatal Vit-Fe Fumarate-FA (PRENATAL MULTIVITAMIN) TABS tablet Take 1 tablet by mouth daily at 12 noon.     No facility-administered medications prior to visit.       ROS:  Review of Systems  Constitutional: Positive for fatigue. Negative for fever and unexpected weight change.  Respiratory: Negative for cough, shortness of breath and wheezing.   Cardiovascular: Negative for chest pain, palpitations and leg swelling.  Gastrointestinal: Negative for blood in stool, constipation, diarrhea, nausea and vomiting.   Endocrine: Negative for cold intolerance, heat intolerance and polyuria.  Genitourinary: Negative for dyspareunia, dysuria, flank pain, frequency, genital sores, hematuria, menstrual problem, pelvic pain, urgency, vaginal bleeding, vaginal discharge and vaginal pain.  Musculoskeletal: Positive for arthralgias. Negative for back pain, joint swelling and myalgias.  Skin: Negative for rash.  Neurological: Negative for dizziness, syncope, light-headedness, numbness and headaches.  Hematological: Negative for adenopathy.  Psychiatric/Behavioral: Negative for agitation, confusion, sleep disturbance and suicidal ideas. The patient is not nervous/anxious.    BREAST: No symptoms   Objective: BP 120/80 (BP Location: Left Arm, Patient Position: Sitting, Cuff Size: Normal)   Pulse (!) 106   Ht 5\' 6"  (1.676 m)   Wt 159 lb (72.1 kg)   LMP 07/29/2018   Breastfeeding No   BMI 25.66 kg/m    Physical Exam Constitutional:      Appearance: She is well-developed.  Genitourinary:     Vulva, vagina, cervix, uterus, right adnexa and left adnexa normal.     No vulval lesion or tenderness noted.     No vaginal discharge, erythema or tenderness.     No cervical polyp.     Uterus is not enlarged or tender.     No right or left adnexal mass present.     Right adnexa not tender.     Left adnexa not tender.  Neck:     Musculoskeletal: Normal range of motion.     Thyroid: No thyromegaly.  Cardiovascular:     Rate and Rhythm: Normal rate and regular rhythm.     Heart sounds: Normal heart sounds. No murmur.  Pulmonary:     Effort: Pulmonary effort is normal.     Breath sounds: Normal breath sounds.  Chest:     Breasts:        Right: No mass, nipple discharge, skin change or tenderness.        Left: No mass, nipple discharge, skin change or tenderness.  Abdominal:     Palpations: Abdomen is soft.     Tenderness: There is no abdominal tenderness. There is no guarding.  Musculoskeletal: Normal range of  motion.  Neurological:     General: No focal deficit present.     Mental Status: She is alert and oriented to person, place, and time.     Cranial Nerves: No cranial nerve deficit.  Skin:    General: Skin is warm and dry.  Psychiatric:        Mood and Affect: Mood normal.        Behavior: Behavior normal.        Thought Content: Thought content normal.        Judgment: Judgment normal.  Vitals signs reviewed.  Assessment/Plan: Encounter for annual routine gynecological examination  Screening for breast cancer - Pt to sched mammo - Plan: MM 3D SCREEN BREAST BILATERAL  Encounter for surveillance of contraceptive pills - OCP RF - Plan: norgestrel-ethinyl estradiol (LO/OVRAL) 0.3-30 MG-MCG tablet  Meds ordered this encounter  Medications  . norgestrel-ethinyl estradiol (LO/OVRAL) 0.3-30 MG-MCG tablet    Sig: Take 1 tablet by mouth daily.    Dispense:  3 Package    Refill:  3    Order Specific Question:   Supervising Provider    Answer:   Gae Dry [[id number]]             GYN counsel breast self exam, mammography screening, adequate intake of calcium and vitamin D, diet and exercise     F/U  Return in about 1 year (around 08/12/2019).  Alicia B. Copland, PA-C 08/12/2018 9:43 AM

## 2018-08-11 NOTE — Patient Instructions (Addendum)
I value your feedback and entrusting us with your care. If you get a [facility] patient survey, I would appreciate you taking the time to let us know about your experience today. Thank you!  [facility]: [phone]  [facility]: [phone]  

## 2018-08-12 ENCOUNTER — Encounter: Payer: Self-pay | Admitting: Obstetrics and Gynecology

## 2018-08-12 ENCOUNTER — Ambulatory Visit (INDEPENDENT_AMBULATORY_CARE_PROVIDER_SITE_OTHER): Payer: 59 | Admitting: Obstetrics and Gynecology

## 2018-08-12 ENCOUNTER — Other Ambulatory Visit: Payer: Self-pay

## 2018-08-12 VITALS — BP 120/80 | HR 106 | Ht 66.0 in | Wt 159.0 lb

## 2018-08-12 DIAGNOSIS — Z01419 Encounter for gynecological examination (general) (routine) without abnormal findings: Secondary | ICD-10-CM

## 2018-08-12 DIAGNOSIS — Z3041 Encounter for surveillance of contraceptive pills: Secondary | ICD-10-CM

## 2018-08-12 DIAGNOSIS — Z1239 Encounter for other screening for malignant neoplasm of breast: Secondary | ICD-10-CM

## 2018-08-12 MED ORDER — NORGESTREL-ETHINYL ESTRADIOL 0.3-30 MG-MCG PO TABS: 1.0000 | ORAL_TABLET | Freq: Every day | ORAL | 3 refills | Status: DC

## 2018-08-26 ENCOUNTER — Other Ambulatory Visit: Payer: Self-pay | Admitting: Obstetrics and Gynecology

## 2018-08-26 DIAGNOSIS — Z3041 Encounter for surveillance of contraceptive pills: Secondary | ICD-10-CM

## 2018-10-22 ENCOUNTER — Encounter: Payer: Self-pay | Admitting: Obstetrics and Gynecology

## 2018-10-22 ENCOUNTER — Other Ambulatory Visit: Payer: Self-pay | Admitting: Certified Nurse Midwife

## 2018-10-22 DIAGNOSIS — O0911 Supervision of pregnancy with history of ectopic or molar pregnancy, first trimester: Secondary | ICD-10-CM

## 2018-11-02 ENCOUNTER — Ambulatory Visit (INDEPENDENT_AMBULATORY_CARE_PROVIDER_SITE_OTHER): Payer: Managed Care, Other (non HMO)

## 2018-11-02 ENCOUNTER — Other Ambulatory Visit (HOSPITAL_COMMUNITY)
Admission: RE | Admit: 2018-11-02 | Discharge: 2018-11-02 | Disposition: A | Discharge status: Home / Self Care | Payer: 59 | Source: Ambulatory Visit | Attending: Certified Nurse Midwife | Admitting: Certified Nurse Midwife

## 2018-11-02 ENCOUNTER — Other Ambulatory Visit: Payer: Self-pay

## 2018-11-02 ENCOUNTER — Ambulatory Visit (INDEPENDENT_AMBULATORY_CARE_PROVIDER_SITE_OTHER): Payer: Managed Care, Other (non HMO) | Admitting: Certified Nurse Midwife

## 2018-11-02 ENCOUNTER — Encounter: Payer: Self-pay | Admitting: Certified Nurse Midwife

## 2018-11-02 VITALS — BP 120/80 | Ht 66.0 in | Wt 154.6 lb

## 2018-11-02 DIAGNOSIS — Z3A01 Less than 8 weeks gestation of pregnancy: Secondary | ICD-10-CM

## 2018-11-02 DIAGNOSIS — Z3490 Encounter for supervision of normal pregnancy, unspecified, unspecified trimester: Secondary | ICD-10-CM

## 2018-11-02 DIAGNOSIS — O0911 Supervision of pregnancy with history of ectopic or molar pregnancy, first trimester: Secondary | ICD-10-CM

## 2018-11-02 DIAGNOSIS — N8312 Corpus luteum cyst of left ovary: Secondary | ICD-10-CM | POA: Diagnosis not present

## 2018-11-02 DIAGNOSIS — O09291 Supervision of pregnancy with other poor reproductive or obstetric history, first trimester: Secondary | ICD-10-CM

## 2018-11-02 DIAGNOSIS — Z113 Encounter for screening for infections with a predominantly sexual mode of transmission: Secondary | ICD-10-CM

## 2018-11-02 DIAGNOSIS — O3481 Maternal care for other abnormalities of pelvic organs, first trimester: Secondary | ICD-10-CM | POA: Diagnosis not present

## 2018-11-02 DIAGNOSIS — O09521 Supervision of elderly multigravida, first trimester: Secondary | ICD-10-CM

## 2018-11-02 DIAGNOSIS — Z8759 Personal history of other complications of pregnancy, childbirth and the puerperium: Secondary | ICD-10-CM

## 2018-11-02 LAB — POCT URINALYSIS DIPSTICK OB
Glucose, UA: NEGATIVE
POC,PROTEIN,UA: NEGATIVE

## 2018-11-02 NOTE — Progress Notes (Signed)
New Obstetric Patient H&P    Chief Complaint: "Desires prenatal care"   History of Present Illness: Kristine Arroyo is a 43 y.o. G75P1021 White female, LMP 09/23/18 who presents with amenorrhea and positive home pregnancy test. Based on her  LMP, her EDD is Estimated Date of Delivery: 06/30/19 and her EGA is 5wk5d.. She thinks she ovulated August 2 and she had a positive pregnancy test on 10/21/2018. Her last pap smear was 06/16/2017 years ago and was NIL/negative HRHPV.    She had a urine pregnancy test which was positive 12 day(s)  ago.  Since her LMP she claims she has experienced smell aversions and breast tenderness. She denies vaginal bleeding. Her past medical history is remarkable for celiac disease and a repair of a right inguinal hernia.. Her prior pregnancies are notable for an EAB with G1, an ectopic with G2 (treated with methotrexate), and  on 05/03/2017 she had a Cesarean section for FITL delivering a 5#11oz baby girl after an IOL for gestational hypertension at [place].   Since her LMP, she admits to the use of tobacco products  No Since her +UPT, she has not drunk any alcohol She denies use of any illicit substances. She claims she has gained   no weight since the start of her pregnancy.  There are cats in the home in the home  no:63} If yes NA She admits close contact with children on a regular basis  yes  She has had chicken pox in the past yes She has had Tuberculosis exposures, symptoms, or previously tested positive for TB   no Current or past history of domestic violence. no  Genetic Screening/Teratology Counseling: (Includes patient, baby's father, or anyone in either family with:)   88. Patient's age >/= 33 at [facility] [place]  yes 2. Thalassemia ([place], [place], [place], or Asian background): MCV<80  no 3. Neural tube defect (meningomyelocele, spina bifida, anencephaly)  no 4. Congenital heart defect  no  5. Down syndrome  no 6. Tay-Sachs (Jewish, [place])  no 7.  Canavan's Disease  no 8. Sickle cell disease or trait (African)  no  9. Hemophilia or other blood disorders  no  10. Muscular dystrophy  no  11. Cystic fibrosis  no  12. Huntington's Chorea  no  13. Mental retardation/autism  no 14. Other inherited genetic or chromosomal disorder  no 15. Maternal metabolic disorder (DM, PKU, etc)  no 16. Patient or FOB with a child with a birth defect not listed above no  16a. Patient or FOB with a birth defect themselves no 17. Recurrent pregnancy loss, or stillbirth  no  18. Any medications since LMP other than prenatal vitamins (include vitamins, supplements, OTC meds, drugs, alcohol)  no 19. Any other genetic/environmental exposure to discuss  no  Infection History:   1. Lives with someone with TB or TB exposed  no  2. Patient or partner has history of genital herpes  no 3. Rash or viral illness since LMP  no 4. History of STI (GC, CT, HPV, syphilis, HIV)  no 5. History of recent travel :  no  Other pertinent information:  Yes, Larkin Ina, age 27 is her husband and will be involved with her care     Review of Systems:10 point review of systems negative unless otherwise noted in HPI  Past Medical History:  Past Medical History:  Diagnosis Date  . Celiac disease   . Gestational hypertension affecting third pregnancy     Past Surgical History:  Past Surgical History:  Procedure Laterality Date  . CESAREAN SECTION N/A 05/03/2017   Procedure: CESAREAN SECTION;  Surgeon: Gae Dry, MD;  Location: [facility] ORS;  Service: Obstetrics;  Laterality: N/A;  . HERNIA REPAIR      Gynecologic History: Patient's last menstrual period was 09/23/2018 (exact date).  Obstetric History: [id number] OB History  Gravida Para Term Preterm AB Living  4 1 1  0 2 1  SAB TAB Ectopic Multiple Live Births  0 1 1   1     # Outcome Date GA Lbr Len/2nd Weight Sex Delivery Anes PTL Lv  4 Current           3 Term 05/03/17 [redacted]w[redacted]d  5 lb 11 oz (2.58 kg) F CS-LTranv   LIV      Complications: Fetal Intolerance, Gestational hypertension  2 Ectopic 02/2016     ECTOPIC     1 TAB 1995     TAB      Family History:  Family History  Problem Relation Age of Onset  . Diabetes Mellitus II Maternal Grandfather   . Throat cancer Maternal Grandfather   . Pancreatic cancer Paternal Grandmother   . Diabetes Mellitus II Maternal Grandmother     Social History:  Social History   Socioeconomic History  . Marital status: Married    Spouse name: Larkin Ina  . Number of children: 1  . Years of education: 40  . Highest education level: Not on file  Occupational History  . Occupation: Careers adviser  Social Needs  . Financial resource strain: Not on file  . Food insecurity    Worry: Not on file    Inability: Not on file  . Transportation needs    Medical: Not on file    Non-medical: Not on file  Tobacco Use  . Smoking status: Never Smoker  . Smokeless tobacco: Never Used  Substance and Sexual Activity  . Alcohol use: Not Currently  . Drug use: No  . Sexual activity: Yes    Birth control/protection: None  Lifestyle  . Physical activity    Days per week: 0 days    Minutes per session: Not on file  . Stress: Not on file  Relationships  . Social Herbalist on phone: Not on file    Gets together: Not on file    Attends religious service: Not on file    Active member of club or organization: Not on file    Attends meetings of clubs or organizations: Not on file    Relationship status: Not on file  . Intimate partner violence    Fear of current or ex partner: Not on file    Emotionally abused: Not on file    Physically abused: Not on file    Forced sexual activity: Not on file  Other Topics Concern  . Not on file  Social History Narrative  . Not on file    Allergies:  Allergies  Allergen Reactions  . Gluten Meal Nausea And Vomiting    Compares to food poisoning    Medications: Prior to Admission medications   Medication Sig  Start Date End Date Taking? Authorizing Provider  Prenatal Vit-Fe Fumarate-FA (PRENATAL MULTIVITAMIN) TABS tablet Take 1 tablet by mouth daily at 12 noon.   Yes [provider]    Physical Exam Vitals: BP 120/80   Wt 154 lb 9.6 oz (70.1 kg)   LMP 09/23/2018 (Exact Date)   BMI 24.95 kg/m  General: WF in  NAD HEENT: normocephalic, anicteric Thyroid: no enlargement, no palpable nodules Pulmonary: No increased work of breathing, CTAB Cardiovascular: RRR, with no murmur Abdomen: soft, non-tender, non-distended.  Umbilicus without lesions.  No hepatomegaly or masses palpable. No evidence of hernia  Genitourinary:  External: Normal external female genitalia.  Normal urethral meatus, normal Bartholin's and Skene's glands.    Vagina: Normal vaginal mucosa, no evidence of prolapse.    Cervix: closed, no bleeding  Uterus: deferred due to ultrasound today  Adnexa: deferred due to ultrasound today  Rectal: deferred Extremities: no edema, erythema, or tenderness Neurologic: Grossly intact Psychiatric: mood appropriate, affect full   Ultrasound today: There is a single questionable gestational sac seen within the uterus measuring 6.1 mm No yolk sac, fetal pole or heartbeat is seen at this time.   Right Ovary is normal in appearance. Left Ovary is normal appearance. Corpus luteal cyst:  Left ovary Survey of the adnexa demonstrates no adnexal masses. There is no free peritoneal fluid in the cul de sac.  Impression:  1. There is one gestational sac seen within the uterus. No yolk sac, fetal pole or heartbeat is seen at this time.    Assessment: 43 y.o. [id number] at 5wk5d gestation by LMP initiating prenatal care Appears to be intrauterine gestational sac, but no yolk sac or fetal pole seen-may be too early in pregnancy to see these structures AMA Prior Cesarean section for FITL  History of gestational hypertension   Plan: 1) Avoid alcoholic beverages. 2) NOB labs, CMP, PC  ratio today 3) ROB/ viability scan in 2 weeks  4) Take prenatal vitamins daily.  5) Nutrition, food safety (fish, cheese advisories, and high nitrite foods) and exercise discussed. 6) Hospital and practice style discussed with cross coverage system.  7) AMA/ Genetic Screening, such as with 1st Trimester Screening, cell free fetal DNA, AFP testing, and Ultrasound, as well as with amniocentesis and CVS as appropriate, is discussed with patient. At the conclusion of today's visit patient requested genetic testing (NIPT) 8) Patient is asked about travel to areas at risk for the Zika virus, and counseled to avoid travel and exposure to mosquitoes or sexual partners who may have themselves been exposed to the virus. Testing is discussed, and will be ordered as appropriate.  9) Prior Cesarean section discussed briefly and advised she would have a choice of TOLAC vs repeat Cesarean section.  Dalia Heading, CNM    C/o a little bit of nausea.rj

## 2018-11-03 LAB — COMPREHENSIVE METABOLIC PANEL
ALT: 21 IU/L (ref 0–32)
AST: 12 IU/L (ref 0–40)
Albumin/Globulin Ratio: 2 (ref 1.2–2.2)
Albumin: 4.6 g/dL (ref 3.8–4.8)
Alkaline Phosphatase: 53 IU/L (ref 39–117)
BUN/Creatinine Ratio: 8 — ABNORMAL LOW (ref 9–23)
BUN: 6 mg/dL (ref 6–24)
Bilirubin Total: 0.3 mg/dL (ref 0.0–1.2)
CO2: 19 mmol/L — ABNORMAL LOW (ref 20–29)
Calcium: 9.7 mg/dL (ref 8.7–10.2)
Chloride: 103 mmol/L (ref 96–106)
Creatinine, Ser: 0.74 mg/dL (ref 0.57–1.00)
GFR calc Af Amer: 116 mL/min/{1.73_m2} (ref >59)
GFR calc non Af Amer: 100 mL/min/{1.73_m2} (ref >59)
Globulin, Total: 2.3 g/dL (ref 1.5–4.5)
Glucose: 77 mg/dL (ref 65–99)
Potassium: 4.3 mmol/L (ref 3.5–5.2)
Sodium: 137 mmol/L (ref 134–144)
Total Protein: 6.9 g/dL (ref 6.0–8.5)

## 2018-11-03 LAB — RPR+RH+ABO+RUB AB+AB SCR+CB...
Antibody Screen: NEGATIVE
HIV Screen 4th Generation wRfx: NONREACTIVE
Hematocrit: 38 % (ref 34.0–46.6)
Hemoglobin: 12.7 g/dL (ref 11.1–15.9)
Hepatitis B Surface Ag: NEGATIVE
MCH: 28.7 pg (ref 26.6–33.0)
MCHC: 33.4 g/dL (ref 31.5–35.7)
MCV: 86 fL (ref 79–97)
Platelets: 205 10*3/uL (ref 150–450)
RBC: 4.42 x10E6/uL (ref 3.77–5.28)
RDW: 12.8 % (ref 11.7–15.4)
RPR Ser Ql: NONREACTIVE
Rh Factor: POSITIVE
Rubella Antibodies, IGG: 4.7 index (ref >0.99)
Varicella zoster IgG: 959 index (ref >165)
WBC: 7.3 10*3/uL (ref 3.4–10.8)

## 2018-11-03 LAB — PROTEIN / CREATININE RATIO, URINE
Creatinine, Urine: 23.2 mg/dL
Protein, Ur: <4 mg/dL

## 2018-11-04 LAB — URINE DRUG PANEL 7
Amphetamines, Urine: NEGATIVE ng/mL
Barbiturate Quant, Ur: NEGATIVE ng/mL
Benzodiazepine Quant, Ur: NEGATIVE ng/mL
Cannabinoid Quant, Ur: NEGATIVE ng/mL
Cocaine (Metab.): NEGATIVE ng/mL
Opiate Quant, Ur: NEGATIVE ng/mL
PCP Quant, Ur: NEGATIVE ng/mL

## 2018-11-04 LAB — CERVICOVAGINAL ANCILLARY ONLY
Chlamydia: NEGATIVE
Neisseria Gonorrhea: NEGATIVE

## 2018-11-04 LAB — URINE CULTURE

## 2018-11-13 ENCOUNTER — Other Ambulatory Visit: Payer: Self-pay | Admitting: Maternal Newborn

## 2018-11-13 ENCOUNTER — Ambulatory Visit (INDEPENDENT_AMBULATORY_CARE_PROVIDER_SITE_OTHER): Payer: Managed Care, Other (non HMO)

## 2018-11-13 ENCOUNTER — Other Ambulatory Visit: Payer: Self-pay

## 2018-11-13 ENCOUNTER — Ambulatory Visit (INDEPENDENT_AMBULATORY_CARE_PROVIDER_SITE_OTHER): Payer: Managed Care, Other (non HMO) | Admitting: Maternal Newborn

## 2018-11-13 VITALS — BP 120/90 | Wt 152.0 lb

## 2018-11-13 DIAGNOSIS — Z3491 Encounter for supervision of normal pregnancy, unspecified, first trimester: Secondary | ICD-10-CM

## 2018-11-13 DIAGNOSIS — Z3A01 Less than 8 weeks gestation of pregnancy: Secondary | ICD-10-CM | POA: Diagnosis not present

## 2018-11-13 DIAGNOSIS — N8312 Corpus luteum cyst of left ovary: Secondary | ICD-10-CM | POA: Diagnosis not present

## 2018-11-13 DIAGNOSIS — O09521 Supervision of elderly multigravida, first trimester: Secondary | ICD-10-CM

## 2018-11-13 DIAGNOSIS — Z369 Encounter for antenatal screening, unspecified: Secondary | ICD-10-CM

## 2018-11-13 DIAGNOSIS — O3481 Maternal care for other abnormalities of pelvic organs, first trimester: Secondary | ICD-10-CM

## 2018-11-13 NOTE — Progress Notes (Signed)
    Routine Prenatal Care Visit  Subjective  Kristine Arroyo is a 43 y.o. G4P1021 at [redacted]w[redacted]d being seen today for ongoing prenatal care.  She is currently monitored for the following issues for this high-risk pregnancy and has Celiac disease; Lumbago; Postpartum care following cesarean delivery; and Supervision of high risk elderly multigravida in first trimester on their problem list.  ----------------------------------------------------------------------------------- Patient reports that she has had some spotting today, small amount on two occasions.   Vag. Bleeding: Scant.  ----------------------------------------------------------------------------------- The following portions of the patient's history were reviewed and updated as appropriate: allergies, current medications, past family history, past medical history, past social history, past surgical history and problem list. Problem list updated.   Objective  Blood pressure 120/90, weight 152 lb (68.9 kg), last menstrual period 09/23/2018, not currently breastfeeding. Pregravid weight 154 lb (69.9 kg) Total Weight Gain -2 lb (-0.907 kg)   General:  Alert, oriented and cooperative. Patient is in no acute distress.  Skin: Skin is warm and dry. No rash noted.   Cardiovascular: Normal heart rate noted  Respiratory: Normal respiratory effort, no problems with respiration noted  Abdomen: Soft, gravid, appropriate for gestational age. Pain/Pressure: Absent     Pelvic:  Cervical exam deferred        Extremities: Normal range of motion.     Mental Status: Normal mood and affect. Normal behavior. Normal judgment and thought content.     Assessment   43 y.o. [id number] at [redacted]w[redacted]d, EDD 06/30/2019 by Last Menstrual Period presenting for a routine prenatal visit.  Plan   FOURTH Problems (from 11/02/18 to present)    No problems associated with this episode.     Ultrasound shows a singleton IUP dated at 6 weeks, CRL 3.4 mm, with no fetal heart rate.  Discussed uncertainty of these findings with patient. She declines trending beta hCGs at this time. Plan is to return for repeat ultrasound in one week.  Return in about 1 week (around 11/20/2018) for ROB/viability/ultrasound.  Avel Sensor, CNM 11/13/2018  11:52 AM

## 2018-11-13 NOTE — Progress Notes (Signed)
ROB/Viability- pt started spotting this morning, no abnormal pain

## 2018-11-20 ENCOUNTER — Ambulatory Visit (INDEPENDENT_AMBULATORY_CARE_PROVIDER_SITE_OTHER): Payer: Managed Care, Other (non HMO)

## 2018-11-20 ENCOUNTER — Other Ambulatory Visit: Payer: Self-pay

## 2018-11-20 ENCOUNTER — Ambulatory Visit (INDEPENDENT_AMBULATORY_CARE_PROVIDER_SITE_OTHER): Payer: Managed Care, Other (non HMO) | Admitting: Maternal Newborn

## 2018-11-20 VITALS — BP 120/80 | Wt 156.0 lb

## 2018-11-20 DIAGNOSIS — O3481 Maternal care for other abnormalities of pelvic organs, first trimester: Secondary | ICD-10-CM | POA: Diagnosis not present

## 2018-11-20 DIAGNOSIS — O039 Complete or unspecified spontaneous abortion without complication: Secondary | ICD-10-CM | POA: Diagnosis not present

## 2018-11-20 DIAGNOSIS — N8312 Corpus luteum cyst of left ovary: Secondary | ICD-10-CM

## 2018-11-20 DIAGNOSIS — Z369 Encounter for antenatal screening, unspecified: Secondary | ICD-10-CM

## 2018-11-20 NOTE — Progress Notes (Signed)
Obstetrics & Gynecology Office Visit   Chief Complaint:  Chief Complaint  Patient presents with  . Routine Prenatal Visit  . Threatened Miscarriage    pt reports bleeding for 1 week    History of Present Illness: Kristine Arroyo began to have heavy bleeding and strong cramping on Saturday, with passage of clots and tissue. She has continued bleeding this week, lighter since Saturday, and describes dark red blood, enough to wear a pad or panty liner. Cramping has decreased.  Review of Systems: Review of systems negative unless otherwise noted in HPI  Past Medical History:  Past Medical History:  Diagnosis Date  . Celiac disease     Past Surgical History:  Past Surgical History:  Procedure Laterality Date  . CESAREAN SECTION N/A 05/03/2017   Procedure: CESAREAN SECTION;  Surgeon: Gae Dry, MD;  Location: [facility] ORS;  Service: Obstetrics;  Laterality: N/A;  . HERNIA REPAIR      Gynecologic History: Patient's last menstrual period was 09/23/2018 (exact date).  Obstetric History: [id number]  Family History:  Family History  Problem Relation Age of Onset  . Cancer Maternal Grandfather   . Cancer Paternal Grandmother     Social History:  Social History   Socioeconomic History  . Marital status: Married    Spouse name: Not on file  . Number of children: 1  . Years of education: 75  . Highest education level: Not on file  Occupational History  . Occupation: Careers adviser  Social Needs  . Financial resource strain: Not on file  . Food insecurity    Worry: Not on file    Inability: Not on file  . Transportation needs    Medical: Not on file    Non-medical: Not on file  Tobacco Use  . Smoking status: Never Smoker  . Smokeless tobacco: Never Used  Substance and Sexual Activity  . Alcohol use: No  . Drug use: No  . Sexual activity: Yes    Birth control/protection: None  Lifestyle  . Physical activity    Days per week: 0 days    Minutes per session: Not on  file  . Stress: Not on file  Relationships  . Social Herbalist on phone: Not on file    Gets together: Not on file    Attends religious service: Not on file    Active member of club or organization: Not on file    Attends meetings of clubs or organizations: Not on file    Relationship status: Not on file  . Intimate partner violence    Fear of current or ex partner: Not on file    Emotionally abused: Not on file    Physically abused: Not on file    Forced sexual activity: Not on file  Other Topics Concern  . Not on file  Social History Narrative  . Not on file    Allergies:  Allergies  Allergen Reactions  . Gluten Meal Nausea And Vomiting    Compares to food poisoning    Medications: Prior to Admission medications   Medication Sig Start Date End Date Taking? Authorizing Provider  Prenatal Vit-Fe Fumarate-FA (PRENATAL MULTIVITAMIN) TABS tablet Take 1 tablet by mouth daily at 12 noon.    [provider]    Physical Exam Vitals:  Vitals:   11/20/18 0925  BP: 120/80   Patient's last menstrual period was 09/23/2018 (exact date).  General: NAD HEENT: normocephalic, anicteric Pulmonary: No increased work of breathing  Neurologic: Grossly intact Psychiatric: mood appropriate, affect full  Assessment: 43 y.o. [id number] with a miscarriage  Plan: Problem List Items Addressed This Visit    None    Visit Diagnoses    Miscarriage    -  Primary     Ultrasound today shows a miscarriage in process with a collapsing gestational sac (6.8 mm) and placental tissue (7 x 6 x 4 mm).   Condolences were given to the patient. Discussed course of miscarriage; she is having normal symptoms at present. Advised of warning signs of problems such as fever, onset of heavy bleeding, foul odor, or severe cramping. She will call with any questions or concerns.  Avel Sensor, CNM 11/20/2018  9:49 AM

## 2018-11-22 ENCOUNTER — Encounter: Payer: Self-pay | Admitting: Certified Nurse Midwife

## 2018-11-22 DIAGNOSIS — O139 Gestational [pregnancy-induced] hypertension without significant proteinuria, unspecified trimester: Secondary | ICD-10-CM | POA: Insufficient documentation

## 2018-11-22 NOTE — Progress Notes (Signed)
NOB today. Ultrasound to rule out ectopic revealed intrauterine gestational sac, but no yolk sac or fetal pole. NOB labs today along with CMP, PC ratio due to hx of gestational hypertension Other risk factors: AMA and prior Cesarean section for FITL. ROB and viability scan in 2 weeks.  Dalia Heading, CNM

## 2019-02-18 ENCOUNTER — Other Ambulatory Visit: Payer: Self-pay

## 2019-02-18 ENCOUNTER — Other Ambulatory Visit (HOSPITAL_COMMUNITY)
Admission: RE | Admit: 2019-02-18 | Discharge: 2019-02-18 | Disposition: A | Discharge status: Home / Self Care | Payer: Managed Care, Other (non HMO) | Source: Ambulatory Visit | Attending: Obstetrics and Gynecology | Admitting: Obstetrics and Gynecology

## 2019-02-18 ENCOUNTER — Encounter: Payer: Managed Care, Other (non HMO) | Admitting: Obstetrics and Gynecology

## 2019-02-18 ENCOUNTER — Encounter: Payer: Self-pay | Admitting: Obstetrics and Gynecology

## 2019-02-18 ENCOUNTER — Ambulatory Visit (INDEPENDENT_AMBULATORY_CARE_PROVIDER_SITE_OTHER): Payer: Managed Care, Other (non HMO) | Admitting: Obstetrics and Gynecology

## 2019-02-18 VITALS — BP 129/82 | HR 83 | Wt 154.0 lb

## 2019-02-18 DIAGNOSIS — Z3201 Encounter for pregnancy test, result positive: Secondary | ICD-10-CM | POA: Diagnosis not present

## 2019-02-18 DIAGNOSIS — O0940 Supervision of pregnancy with grand multiparity, unspecified trimester: Secondary | ICD-10-CM | POA: Insufficient documentation

## 2019-02-18 DIAGNOSIS — O0990 Supervision of high risk pregnancy, unspecified, unspecified trimester: Secondary | ICD-10-CM | POA: Insufficient documentation

## 2019-02-18 DIAGNOSIS — Z3A01 Less than 8 weeks gestation of pregnancy: Secondary | ICD-10-CM | POA: Insufficient documentation

## 2019-02-18 DIAGNOSIS — Z8759 Personal history of other complications of pregnancy, childbirth and the puerperium: Secondary | ICD-10-CM

## 2019-02-18 DIAGNOSIS — O09521 Supervision of elderly multigravida, first trimester: Secondary | ICD-10-CM | POA: Diagnosis present

## 2019-02-18 DIAGNOSIS — O09291 Supervision of pregnancy with other poor reproductive or obstetric history, first trimester: Secondary | ICD-10-CM

## 2019-02-18 DIAGNOSIS — O09529 Supervision of elderly multigravida, unspecified trimester: Secondary | ICD-10-CM | POA: Insufficient documentation

## 2019-02-18 DIAGNOSIS — N912 Amenorrhea, unspecified: Secondary | ICD-10-CM

## 2019-02-18 DIAGNOSIS — O0991 Supervision of high risk pregnancy, unspecified, first trimester: Secondary | ICD-10-CM

## 2019-02-18 DIAGNOSIS — O34219 Maternal care for unspecified type scar from previous cesarean delivery: Secondary | ICD-10-CM

## 2019-02-18 DIAGNOSIS — O139 Gestational [pregnancy-induced] hypertension without significant proteinuria, unspecified trimester: Secondary | ICD-10-CM | POA: Insufficient documentation

## 2019-02-18 LAB — POCT URINE PREGNANCY: Preg Test, Ur: POSITIVE — AB

## 2019-02-18 NOTE — Progress Notes (Signed)
New Obstetric Patient H&P   Chief Complaint: "Desires prenatal care"   History of Present Illness: Patient is a 43 y.o. [id number] Not Hispanic or Latino female, sure LMP 01/09/2019 presents with amenorrhea and positive home pregnancy test. Based on her  LMP, her EDD is Estimated Date of Delivery: 10/16/19 and her EGA is [redacted]w[redacted]d. Cycles are regular and occur approximately every : 26 days. Her last pap smear was 06/17/2018 and was NILM/HPV negative.    She had a urine pregnancy test which was positive 2 week(s)  ago. Her last menstrual period was normal and lasted for  Slightly longer due to some excess spotting. Since her LMP she claims she has experienced no issues. She denies vaginal bleeding. Her past medical history is remarkable for celiac disease and repair of right inguinal hernia. Her prior pregnancies are notable for G1 - EAB, G2 - ectopic treated with methotrexate, G3 - cesarean section on 05/03/2017 for fetal intolerance of labor delivering a 5 lb 11 oz girl after induction of labor for gestational hypertension at [redacted]w[redacted]d, G4 - early SAB.    Since her LMP, she admits to the use of tobacco products  no She claims she has gained zero pounds since the start of her pregnancy.  There are cats in the home in the home  no  She admits close contact with children on a regular basis  yes  She has had chicken pox in the past yes She has had Tuberculosis exposures, symptoms, or previously tested positive for TB   no Current or past history of domestic violence. no  Genetic Screening/Teratology Counseling: (Includes patient, baby's father, or anyone in either family with:)   56. Patient's age >/= 15 at [facility]  yes 2. Thalassemia ([place], [place], [place], or Asian background): MCV<80  no 3. Neural tube defect (meningomyelocele, spina bifida, anencephaly)  no 4. Congenital heart defect  no  5. Down syndrome  no 6. Tay-Sachs (Jewish, [place])  no 7. Canavan's Disease  no 8. Sickle cell disease  or trait (African)  no  9. Hemophilia or other blood disorders  no  10. Muscular dystrophy  no  11. Cystic fibrosis  no  12. Huntington's Chorea  no  13. Mental retardation/autism  no 14. Other inherited genetic or chromosomal disorder  no 15. Maternal metabolic disorder (DM, PKU, etc)  no 16. Patient or FOB with a child with a birth defect not listed above no  16a. Patient or FOB with a birth defect themselves no 17. Recurrent pregnancy loss, or stillbirth  no  18. Any medications since LMP other than prenatal vitamins (include vitamins, supplements, OTC meds, drugs, alcohol)  no 19. Any other genetic/environmental exposure to discuss  no  Infection History:   1. Lives with someone with TB or TB exposed  no  2. Patient or partner has history of genital herpes  no 3. Rash or viral illness since LMP  no 4. History of STI (GC, CT, HPV, syphilis, HIV)  no 5. History of recent travel :  no  Other pertinent information:  no     Review of Systems:10 point review of systems negative unless otherwise noted in HPI  Past Medical History:  Diagnosis Date  . Celiac disease   . Gestational hypertension affecting third pregnancy     Past Surgical History:  Procedure Laterality Date  . CESAREAN SECTION N/A 05/03/2017   Procedure: CESAREAN SECTION;  Surgeon: Gae Dry, MD;  Location: [facility] ORS;  Service: Obstetrics;  Laterality:  N/A;  . HERNIA REPAIR      Gynecologic History: Patient's last menstrual period was 01/09/2019.  Obstetric History: [id number]  Family History  Problem Relation Age of Onset  . Diabetes Mellitus II Maternal Grandfather   . Throat cancer Maternal Grandfather   . Pancreatic cancer Paternal Grandmother   . Diabetes Mellitus II Maternal Grandmother     Social History   Socioeconomic History  . Marital status: Married    Spouse name: Larkin Ina  . Number of children: 1  . Years of education: 73  . Highest education level: Not on file  Occupational History   . Occupation: Careers adviser  Tobacco Use  . Smoking status: Never Smoker  . Smokeless tobacco: Never Used  Substance and Sexual Activity  . Alcohol use: Not Currently  . Drug use: No  . Sexual activity: Yes    Birth control/protection: None  Other Topics Concern  . Not on file  Social History Narrative  . Not on file   Social Determinants of Health   Financial Resource Strain:   . Difficulty of Paying Living Expenses: Not on file  Food Insecurity:   . Worried About Charity fundraiser in the Last Year: Not on file  . Ran Out of Food in the Last Year: Not on file  Transportation Needs:   . Lack of Transportation (Medical): Not on file  . Lack of Transportation (Non-Medical): Not on file  Physical Activity: Unknown  . Days of Exercise per Week: 0 days  . Minutes of Exercise per Session: Not on file  Stress:   . Feeling of Stress : Not on file  Social Connections:   . Frequency of Communication with Friends and Family: Not on file  . Frequency of Social Gatherings with Friends and Family: Not on file  . Attends Religious Services: Not on file  . Active Member of Clubs or Organizations: Not on file  . Attends Archivist Meetings: Not on file  . Marital Status: Not on file  Intimate Partner Violence:   . Fear of Current or Ex-Partner: Not on file  . Emotionally Abused: Not on file  . Physically Abused: Not on file  . Sexually Abused: Not on file    Allergies  Allergen Reactions  . Gluten Meal Nausea And Vomiting    Compares to food poisoning    Prior to Admission medications   Medication Sig Start Date End Date Taking? Authorizing Provider  Prenatal Vit-Fe Fumarate-FA (PRENATAL MULTIVITAMIN) TABS tablet Take 1 tablet by mouth daily at 12 noon.    [provider]    Physical Exam BP 129/82   Pulse 83   Wt 154 lb (69.9 kg)   LMP 01/09/2019   BMI 24.86 kg/m   Physical Exam Vitals reviewed. Exam conducted with a chaperone present.   Constitutional:      General: She is not in acute distress.    Appearance: Normal appearance.  HENT:     Head: Normocephalic and atraumatic.  Eyes:     General: No scleral icterus.    Conjunctiva/sclera: Conjunctivae normal.  Cardiovascular:     Rate and Rhythm: Normal rate and regular rhythm.     Heart sounds: No murmur. No friction rub. No gallop.   Pulmonary:     Effort: Pulmonary effort is normal. No respiratory distress.     Breath sounds: Normal breath sounds. No wheezing, rhonchi or rales.  Abdominal:     General: There is no distension.  Palpations: Abdomen is soft. There is no mass.     Tenderness: There is no abdominal tenderness. There is no guarding or rebound.  Genitourinary:    General: Normal vulva.     Exam position: Lithotomy position.     Tanner stage (genital): 5.     Labia:        Right: No rash, tenderness, lesion or injury.        Left: No rash, tenderness, lesion or injury.      Vagina: Normal.     Cervix: Normal.     Uterus: Normal.      Adnexa: Right adnexa normal and left adnexa normal.  Musculoskeletal:        General: No swelling. Normal range of motion.  Skin:    General: Skin is warm and dry.  Neurological:     General: No focal deficit present.     Mental Status: She is oriented to person, place, and time.     Cranial Nerves: No cranial nerve deficit.  Psychiatric:        Mood and Affect: Mood normal.        Behavior: Behavior normal.        Judgment: Judgment normal.     Female Chaperone present during breast and/or pelvic exam.   Assessment: 43 y.o. [id number] at [redacted]w[redacted]d presenting to initiate prenatal care  Plan: 1) Avoid alcoholic beverages. 2) Patient encouraged not to smoke.  3) Discontinue the use of all non-medicinal drugs and chemicals.  4) Take prenatal vitamins daily.  5) Nutrition, food safety (fish, cheese advisories, and high nitrite foods) and exercise discussed. 6) Hospital and practice style discussed with cross  coverage system.  7) Genetic Screening, such as with 1st Trimester Screening, cell free fetal DNA, AFP testing, and Ultrasound, as well as with amniocentesis and CVS as appropriate, is discussed with patient. At the conclusion of today's visit patient requested genetic testing 8) Patient is asked about travel to areas at risk for the [place] virus, and counseled to avoid travel and exposure to mosquitoes or sexual partners who may have themselves been exposed to the virus. Testing is discussed, and will be ordered as appropriate.   Prentice Docker, MD 02/18/2019 8:12 AM

## 2019-02-18 NOTE — Progress Notes (Unsigned)
New Obstetric Patient H&P   Chief Complaint: "Desires prenatal care"   History of Present Illness: Patient is a 43 y.o. [id number] Not Hispanic or Latino female, LMP *** presents with amenorrhea and positive home pregnancy test. Based on her  LMP, her EDD is Estimated Date of Delivery: 07/09/19 and her EGA is [redacted]w[redacted]d. Cycles are {0-35:19561} {days/wks/mos/yrs:310907}, {Desc; regular/irreg:14544}, and occur approximately every : {numbers 22-35:14824} days. Her last pap smear was 06/16/2017 and was NIL/negative HPV.    She had a urine pregnancy test which was positive {numbers (fuzzy):14653} {time frame:9076}  ago. Her last menstrual period was normal and lasted for  {numbers (fuzzy):14653} {time frame:9076}. Since her LMP she claims she has experienced ***. She denies vaginal bleeding. Her past medical history is remarkable for celiac disease and a repair of a right inguinal hernia. Her prior pregnancies are notable for G1 with EAB, G2 for ectopic pregnancy treated with methotrexate, G3 for delivery by c-section on 05/03/2017 due to fetal intolerance of labor (baby girl 5 lb 11 oz) after induction of labor for gestational hypertension at [redacted]w[redacted]d.  G4 resulted in SAB.   Since her LMP, she admits to the use of tobacco products  {yes/no:63} She claims she has gained   {inf wt change:14817} pounds since the start of her pregnancy.  There are cats in the home in the home  no  She admits close contact with children on a regular basis  yes  She has had chicken pox in the past yes She has had Tuberculosis exposures, symptoms, or previously tested positive for TB   no Current or past history of domestic violence. no  Genetic Screening/Teratology Counseling: (Includes patient, baby's father, or anyone in either family with:)   60. Patient's age >/= 3 at [facility]  yes 2. Thalassemia ([place], [place], [place], or Asian background): MCV<80  no 3. Neural tube defect (meningomyelocele, spina bifida, anencephaly)  no 4.  Congenital heart defect  no  5. Down syndrome  no 6. Tay-Sachs (Jewish, [place])  no 7. Canavan's Disease  no 8. Sickle cell disease or trait (African)  no  9. Hemophilia or other blood disorders  no  10. Muscular dystrophy  no  11. Cystic fibrosis  no  12. Huntington's Chorea  no  13. Mental retardation/autism  no 14. Other inherited genetic or chromosomal disorder  no 15. Maternal metabolic disorder (DM, PKU, etc)  no 16. Patient or FOB with a child with a birth defect not listed above no  16a. Patient or FOB with a birth defect themselves no 17. Recurrent pregnancy loss, or stillbirth  no  18. Any medications since LMP other than prenatal vitamins (include vitamins, supplements, OTC meds, drugs, alcohol)  {yes/no:63} 19. Any other genetic/environmental exposure to discuss  no  Infection History:   1. Lives with someone with TB or TB exposed  no  2. Patient or partner has history of genital herpes  no 3. Rash or viral illness since LMP  no 4. History of STI (GC, CT, HPV, syphilis, HIV)  no 5. History of recent travel :  no  Other pertinent information:  no     Review of Systems:10 point review of systems negative unless otherwise noted in HPI  Past Medical History:  Diagnosis Date  . Celiac disease   . Gestational hypertension affecting third pregnancy     Past Surgical History:  Procedure Laterality Date  . CESAREAN SECTION N/A 05/03/2017   Procedure: CESAREAN SECTION;  Surgeon: Gae Dry, MD;  Location: [facility] ORS;  Service: Obstetrics;  Laterality: N/A;  . HERNIA REPAIR      Gynecologic History: Patient's last menstrual period was 01/09/2019.  Obstetric History: [id number]  Family History  Problem Relation Age of Onset  . Diabetes Mellitus II Maternal Grandfather   . Throat cancer Maternal Grandfather   . Pancreatic cancer Paternal Grandmother   . Diabetes Mellitus II Maternal Grandmother     Social History   Socioeconomic History  . Marital  status: Married    Spouse name: Larkin Ina  . Number of children: 1  . Years of education: 67  . Highest education level: Not on file  Occupational History  . Occupation: Careers adviser  Tobacco Use  . Smoking status: Never Smoker  . Smokeless tobacco: Never Used  Substance and Sexual Activity  . Alcohol use: Not Currently  . Drug use: No  . Sexual activity: Yes    Birth control/protection: None  Other Topics Concern  . Not on file  Social History Narrative  . Not on file   Social Determinants of Health   Financial Resource Strain:   . Difficulty of Paying Living Expenses: Not on file  Food Insecurity:   . Worried About Charity fundraiser in the Last Year: Not on file  . Ran Out of Food in the Last Year: Not on file  Transportation Needs:   . Lack of Transportation (Medical): Not on file  . Lack of Transportation (Non-Medical): Not on file  Physical Activity: Unknown  . Days of Exercise per Week: 0 days  . Minutes of Exercise per Session: Not on file  Stress:   . Feeling of Stress : Not on file  Social Connections:   . Frequency of Communication with Friends and Family: Not on file  . Frequency of Social Gatherings with Friends and Family: Not on file  . Attends Religious Services: Not on file  . Active Member of Clubs or Organizations: Not on file  . Attends Archivist Meetings: Not on file  . Marital Status: Not on file  Intimate Partner Violence:   . Fear of Current or Ex-Partner: Not on file  . Emotionally Abused: Not on file  . Physically Abused: Not on file  . Sexually Abused: Not on file    Allergies  Allergen Reactions  . Gluten Meal Nausea And Vomiting    Compares to food poisoning    Prior to Admission medications   Medication Sig Start Date End Date Taking? Authorizing Provider  Prenatal Vit-Fe Fumarate-FA (PRENATAL MULTIVITAMIN) TABS tablet Take 1 tablet by mouth daily at 12 noon.    [provider]    Physical Exam LMP  01/09/2019   Breastfeeding Unknown   Physical Exam Constitutional:      General: She is not in acute distress.    Appearance: Normal appearance.  HENT:     Head: Normocephalic and atraumatic.  Eyes:     General: No scleral icterus.    Conjunctiva/sclera: Conjunctivae normal.  Cardiovascular:     Rate and Rhythm: Normal rate and regular rhythm.     Heart sounds: No murmur. No friction rub. No gallop.   Pulmonary:     Effort: Pulmonary effort is normal. No respiratory distress.     Breath sounds: Normal breath sounds. No wheezing, rhonchi or rales.  Abdominal:     General: There is no distension.     Palpations: Abdomen is soft. There is no mass.     Tenderness: There is  no abdominal tenderness. There is no guarding or rebound.  Musculoskeletal:        General: No swelling. Normal range of motion.     Cervical back: Normal range of motion and neck supple.  Skin:    General: Skin is warm and dry.  Neurological:     General: No focal deficit present.     Mental Status: She is oriented to person, place, and time.     Cranial Nerves: No cranial nerve deficit.  Psychiatric:        Mood and Affect: Mood normal.        Behavior: Behavior normal.        Judgment: Judgment normal.      Female Chaperone present during breast and/or pelvic exam.   Assessment: 43 y.o. [id number] at [redacted]w[redacted]d presenting to initiate prenatal care  Plan: 1) Avoid alcoholic beverages. 2) Patient encouraged not to smoke.  3) Discontinue the use of all non-medicinal drugs and chemicals.  4) Take prenatal vitamins daily.  5) Nutrition, food safety (fish, cheese advisories, and high nitrite foods) and exercise discussed. 6) Hospital and practice style discussed with cross coverage system.  7) Genetic Screening, such as with 1st Trimester Screening, cell free fetal DNA, AFP testing, and Ultrasound, as well as with amniocentesis and CVS as appropriate, is discussed with patient. At the conclusion of today's visit  patient {Desc; requested/declined/undecided:14580} genetic testing 8) Patient is asked about travel to areas at risk for the [place] virus, and counseled to avoid travel and exposure to mosquitoes or sexual partners who may have themselves been exposed to the virus. Testing is discussed, and will be ordered as appropriate.   Prentice Docker, MD 02/18/2019 8:11 AM

## 2019-02-19 LAB — RPR+RH+ABO+RUB AB+AB SCR+CB...
Antibody Screen: NEGATIVE
HIV Screen 4th Generation wRfx: NONREACTIVE
Hematocrit: 37.7 % (ref 34.0–46.6)
Hemoglobin: 13 g/dL (ref 11.1–15.9)
Hepatitis B Surface Ag: NEGATIVE
MCH: 30.4 pg (ref 26.6–33.0)
MCHC: 34.5 g/dL (ref 31.5–35.7)
MCV: 88 fL (ref 79–97)
Platelets: 187 10*3/uL (ref 150–450)
RBC: 4.27 x10E6/uL (ref 3.77–5.28)
RDW: 12.7 % (ref 11.7–15.4)
RPR Ser Ql: NONREACTIVE
Rh Factor: POSITIVE
Rubella Antibodies, IGG: 5.58 index (ref >0.99)
Varicella zoster IgG: 696 index (ref >165)
WBC: 5.9 10*3/uL (ref 3.4–10.8)

## 2019-02-19 LAB — COMPREHENSIVE METABOLIC PANEL
ALT: 17 IU/L (ref 0–32)
AST: 14 IU/L (ref 0–40)
Albumin/Globulin Ratio: 2.2 (ref 1.2–2.2)
Albumin: 4.6 g/dL (ref 3.8–4.8)
Alkaline Phosphatase: 57 IU/L (ref 39–117)
BUN/Creatinine Ratio: 14 (ref 9–23)
BUN: 11 mg/dL (ref 6–24)
Bilirubin Total: 0.3 mg/dL (ref 0.0–1.2)
CO2: 20 mmol/L (ref 20–29)
Calcium: 9.3 mg/dL (ref 8.7–10.2)
Chloride: 105 mmol/L (ref 96–106)
Creatinine, Ser: 0.79 mg/dL (ref 0.57–1.00)
GFR calc Af Amer: 106 mL/min/{1.73_m2} (ref >59)
GFR calc non Af Amer: 92 mL/min/{1.73_m2} (ref >59)
Globulin, Total: 2.1 g/dL (ref 1.5–4.5)
Glucose: 91 mg/dL (ref 65–99)
Potassium: 4.4 mmol/L (ref 3.5–5.2)
Sodium: 140 mmol/L (ref 134–144)
Total Protein: 6.7 g/dL (ref 6.0–8.5)

## 2019-02-19 LAB — CERVICOVAGINAL ANCILLARY ONLY
Chlamydia: NEGATIVE
Comment: NEGATIVE
Comment: NORMAL
Neisseria Gonorrhea: NEGATIVE

## 2019-02-20 LAB — URINE DRUG PANEL 7
Amphetamines, Urine: NEGATIVE ng/mL
Barbiturate Quant, Ur: NEGATIVE ng/mL
Benzodiazepine Quant, Ur: NEGATIVE ng/mL
Cannabinoid Quant, Ur: NEGATIVE ng/mL
Cocaine (Metab.): NEGATIVE ng/mL
Opiate Quant, Ur: NEGATIVE ng/mL
PCP Quant, Ur: NEGATIVE ng/mL

## 2019-02-20 LAB — URINE CULTURE

## 2019-02-20 LAB — PROTEIN / CREATININE RATIO, URINE
Creatinine, Urine: 53.2 mg/dL
Protein, Ur: <4 mg/dL

## 2019-02-24 ENCOUNTER — Other Ambulatory Visit: Payer: Managed Care, Other (non HMO)

## 2019-02-24 ENCOUNTER — Encounter: Payer: Managed Care, Other (non HMO) | Admitting: Obstetrics and Gynecology

## 2019-11-04 ENCOUNTER — Other Ambulatory Visit: Payer: Self-pay

## 2019-11-04 ENCOUNTER — Ambulatory Visit (INDEPENDENT_AMBULATORY_CARE_PROVIDER_SITE_OTHER): Payer: 59 | Admitting: Advanced Practice Midwife

## 2019-11-04 VITALS — BP 130/78 | HR 66 | Ht 66.0 in | Wt 153.0 lb

## 2019-11-04 DIAGNOSIS — Z01419 Encounter for gynecological examination (general) (routine) without abnormal findings: Secondary | ICD-10-CM

## 2019-11-04 DIAGNOSIS — N96 Recurrent pregnancy loss: Secondary | ICD-10-CM

## 2019-11-04 DIAGNOSIS — Z1239 Encounter for other screening for malignant neoplasm of breast: Secondary | ICD-10-CM | POA: Diagnosis not present

## 2019-11-04 NOTE — Progress Notes (Signed)
Gynecology Annual Exam  PCP: Patient, No Pcp Per  Chief Complaint:  Chief Complaint  Patient presents with  . Gynecologic Exam    wants poss hormone testing, two miscarriages last year, not preg yet, decreased libido since dtr born; any additional tests for preg viability?    History of Present Illness: Patient is a 44 y.o. [id number] presents for annual exam. The patient has complaint today of difficulty conceiving this year. She had 2 miscarriages last year and has not gotten pregnant this year. She admits not always actively trying at time of ovulation. She also has had increased stress in the past couple of years due to building a house and finances, etc. She is interested in having fertility/miscarriage focused labs done today.  LMP: Patient's last menstrual period was 10/24/2019 (exact date). Average Interval: regular, 24 days Duration of flow: 4 days Heavy Menses: no Clots: no Intermenstrual Bleeding: no Postcoital Bleeding: no Dysmenorrhea: no   The patient is sexually active. She currently uses none for contraception. She denies dyspareunia.  The patient does perform self breast exams.  There is no notable family history of breast or ovarian cancer in her family.  The patient wears seatbelts: yes.   The patient has regular exercise: she is active daily at her home/land. She admits healthy lifestyle; diet, hydration, sleep.    The patient denies current symptoms of depression.    Review of Systems: Review of Systems  Constitutional: Negative for chills and fever.  HENT: Negative for congestion, ear discharge, ear pain, hearing loss, sinus pain and sore throat.   Eyes: Negative for blurred vision and double vision.  Respiratory: Negative for cough, shortness of breath and wheezing.   Cardiovascular: Negative for chest pain, palpitations and leg swelling.  Gastrointestinal: Negative for abdominal pain, blood in stool, constipation, diarrhea, heartburn, melena, nausea and  vomiting.  Genitourinary: Negative for dysuria, flank pain, frequency, hematuria and urgency.  Musculoskeletal: Negative for back pain, joint pain and myalgias.  Skin: Negative for itching and rash.  Neurological: Negative for dizziness, tingling, tremors, sensory change, speech change, focal weakness, seizures, loss of consciousness, weakness and headaches.  Endo/Heme/Allergies: Negative for environmental allergies. Does not bruise/bleed easily.       Positive for decreased libido, fertility  Psychiatric/Behavioral: Negative for depression, hallucinations, memory loss, substance abuse and suicidal ideas. The patient is not nervous/anxious and does not have insomnia.     Past Medical History:  Patient Active Problem List   Diagnosis Date Noted  . Supervision of high risk pregnancy, antepartum 02/18/2019    Clinic [facility] Prenatal Labs  Dating  Blood type: O/Positive/-- (08/31 1434)   Genetic Screen 1 Screen:    AFP:     Quad:     NIPS: Antibody:Negative (08/31 1434)  Anatomic [place]  Rubella: 4.70 (08/31 1434) Varicella: @VZVIGG @  GTT Early:               Third trimester:  RPR: Non Reactive (08/31 1434)   Rhogam  HBsAg: Negative (08/31 1434)   TDaP vaccine                       Flu Shot: HIV: Non Reactive (08/31 1434)   Baby Food                                GBS:   Contraception  Pap:  CBB     CS/VBAC  Support Person         . History of cesarean delivery affecting pregnancy 02/18/2019     - due to fetal intolerance of labor - desires repeat c-section   . History of gestational hypertension 02/18/2019  . Advanced maternal age in multigravida 02/18/2019  . Lumbago 12/17/2016  . Celiac disease 10/22/2016    Past Surgical History:  Past Surgical History:  Procedure Laterality Date  . CESAREAN SECTION N/A 05/03/2017   Procedure: CESAREAN SECTION;  Surgeon: Gae Dry, MD;  Location: [facility] ORS;  Service: Obstetrics;  Laterality: N/A;  . HERNIA REPAIR      Gynecologic  History:  Patient's last menstrual period was 10/24/2019 (exact date). Contraception: none Last Pap: 2 years ago Results were:  no abnormalities  Last mammogram: 5 years ago Results were: BI-RAD I  Obstetric History: [id number]  Family History:  Family History  Problem Relation Age of Onset  . Diabetes Mellitus II Maternal Grandfather   . Throat cancer Maternal Grandfather   . Pancreatic cancer Paternal Grandmother   . Diabetes Mellitus II Maternal Grandmother     Social History:  Social History   Socioeconomic History  . Marital status: Married    Spouse name: Larkin Ina  . Number of children: 1  . Years of education: 28  . Highest education level: Not on file  Occupational History  . Occupation: Careers adviser  Tobacco Use  . Smoking status: Former Research scientist (life sciences)  . Smokeless tobacco: Never Used  . Tobacco comment: stopped 2003  Vaping Use  . Vaping Use: Never used  Substance and Sexual Activity  . Alcohol use: Yes    Alcohol/week: 1.0 standard drink    Types: 1 Glasses of wine per week    Comment: 1/wk  . Drug use: No  . Sexual activity: Yes    Birth control/protection: None  Other Topics Concern  . Not on file  Social History Narrative  . Not on file   Social Determinants of Health   Financial Resource Strain:   . Difficulty of Paying Living Expenses: Not on file  Food Insecurity:   . Worried About Charity fundraiser in the Last Year: Not on file  . Ran Out of Food in the Last Year: Not on file  Transportation Needs:   . Lack of Transportation (Medical): Not on file  . Lack of Transportation (Non-Medical): Not on file  Physical Activity:   . Days of Exercise per Week: Not on file  . Minutes of Exercise per Session: Not on file  Stress:   . Feeling of Stress : Not on file  Social Connections:   . Frequency of Communication with Friends and Family: Not on file  . Frequency of Social Gatherings with Friends and Family: Not on file  . Attends Religious  Services: Not on file  . Active Member of Clubs or Organizations: Not on file  . Attends Archivist Meetings: Not on file  . Marital Status: Not on file  Intimate Partner Violence:   . Fear of Current or Ex-Partner: Not on file  . Emotionally Abused: Not on file  . Physically Abused: Not on file  . Sexually Abused: Not on file    Allergies:  Allergies  Allergen Reactions  . Gluten Meal Nausea And Vomiting    Compares to food poisoning    Medications: Prior to Admission medications   Medication Sig Start Date End Date Taking? Authorizing Provider  OVER THE COUNTER MEDICATION Take 6 tablets  by mouth daily. Multivitamin gummies 09/13/19  Yes [provider]  Vitamin D, Ergocalciferol, (DRISDOL) 1.25 MG (50000 UNIT) CAPS capsule Take 50,000 Units by mouth daily.   Yes [provider]    Physical Exam Vitals: Blood pressure 130/78, pulse 66, height 5\' 6"  (1.676 m), weight 153 lb (69.4 kg), last menstrual period 10/24/2019, unknown if currently breastfeeding.  General: NAD HEENT: normocephalic, anicteric Thyroid: no enlargement, no palpable nodules Pulmonary: No increased work of breathing, CTAB Cardiovascular: RRR, distal pulses 2+ Breast: Breast symmetrical, no tenderness, no palpable nodules or masses, no skin or nipple retraction present, no nipple discharge.  No axillary or supraclavicular lymphadenopathy. Abdomen: NABS, soft, non-tender, non-distended.  Umbilicus without lesions.  No hepatomegaly, splenomegaly or masses palpable. No evidence of hernia  Genitourinary: deferred for no concerns Extremities: no edema, erythema, or tenderness Neurologic: Grossly intact Psychiatric: mood appropriate, affect full   Assessment: 44 y.o. [id number] routine annual exam   Plan: Problem List Items Addressed This Visit    None    Visit Diagnoses    Well woman exam with routine gynecological exam    -  Primary   Relevant Orders   Progesterone   Estradiol    Beta-2-glycoprotein i abs, IgG/M/A   Cardiolipin antibodies, IgG, IgM, IgA   Factor 5 leiden   Lupus anticoagulant   MTHFR DNA Analysis   Protein C activity   Protein S activity   TSH   MM DIGITAL SCREENING BILATERAL   History of recurrent miscarriages       Relevant Orders   Progesterone   Estradiol   Beta-2-glycoprotein i abs, IgG/M/A   Cardiolipin antibodies, IgG, IgM, IgA   Factor 5 leiden   Lupus anticoagulant   MTHFR DNA Analysis   Protein C activity   Protein S activity   TSH   Encounter for screening for malignant neoplasm of breast, unspecified screening modality       Relevant Orders   MM DIGITAL SCREENING BILATERAL      1) Mammogram - recommend yearly screening mammogram.  Mammogram Was ordered today   2) STI screening  was not offered and therefore not obtained  3) ASCCP guidelines and rationale discussed.  Patient opts for every 3 years screening interval  4) Contraception - the patient is currently using  none.  She is attempting to conceive in the near future  5) Colonoscopy -- Screening recommended starting at age 54 for average risk individuals, age 58 for individuals deemed at increased risk (including African Americans) and recommended to continue until age 53.  For patient age 20-85 individualized approach is recommended.  Gold standard screening is via colonoscopy, Cologuard screening is an acceptable alternative for patient unwilling or unable to undergo colonoscopy.  "Colorectal cancer screening for average?risk adults: 2018 guideline update from the American Cancer Society"CA: A Cancer Journal for Clinicians: Jul 31, 2016   6) Routine healthcare maintenance including cholesterol, diabetes screening discussed managed by PCP   7) History of recurrent miscarriage/infertility: labs ordered today  8) Return in about 1 year (around 11/03/2020) for annual established gyn.   Rod Can, [facility] 11/04/2019, 4:46  PM

## 2019-11-12 LAB — CARDIOLIPIN ANTIBODIES, IGG, IGM, IGA
Anticardiolipin IgA: <9 APL U/mL (ref 0–11)
Anticardiolipin IgG: <9 GPL U/mL (ref 0–14)
Anticardiolipin IgM: 9 MPL U/mL (ref 0–12)

## 2019-11-12 LAB — BETA-2-GLYCOPROTEIN I ABS, IGG/M/A
Beta-2 Glyco 1 IgA: <9 GPI IgA units (ref 0–25)
Beta-2 Glyco 1 IgM: <9 GPI IgM units (ref 0–32)
Beta-2 Glyco I IgG: <9 GPI IgG units (ref 0–20)

## 2019-11-12 LAB — PROGESTERONE: Progesterone: 0.7 ng/mL

## 2019-11-12 LAB — PROTEIN S ACTIVITY: Protein S Activity: 86 % (ref 63–140)

## 2019-11-12 LAB — LUPUS ANTICOAGULANT
Dilute Viper Venom Time: 29.3 s (ref 0.0–47.0)
PTT Lupus Anticoagulant: 32.2 s (ref 0.0–51.9)
Thrombin Time: 18.6 s (ref 0.0–23.0)
dPT Confirm Ratio: 1.03 Ratio (ref 0.00–1.40)
dPT: 40 s (ref 0.0–55.0)

## 2019-11-12 LAB — PROTEIN C ACTIVITY: Protein C Activity: 105 % (ref 73–180)

## 2019-11-12 LAB — ESTRADIOL: Estradiol: 84.8 pg/mL

## 2019-11-12 LAB — TSH: TSH: 2.98 u[IU]/mL (ref 0.450–4.500)

## 2019-11-12 LAB — MTHFR DNA ANALYSIS

## 2019-11-12 LAB — FACTOR 5 LEIDEN

## 2020-01-06 ENCOUNTER — Other Ambulatory Visit: Payer: Self-pay

## 2020-01-06 ENCOUNTER — Ambulatory Visit: Payer: 59 | Admitting: Dermatology

## 2020-01-06 DIAGNOSIS — D485 Neoplasm of uncertain behavior of skin: Secondary | ICD-10-CM | POA: Diagnosis not present

## 2020-01-06 DIAGNOSIS — B360 Pityriasis versicolor: Secondary | ICD-10-CM

## 2020-01-06 DIAGNOSIS — L814 Other melanin hyperpigmentation: Secondary | ICD-10-CM | POA: Diagnosis not present

## 2020-01-06 DIAGNOSIS — L821 Other seborrheic keratosis: Secondary | ICD-10-CM

## 2020-01-06 DIAGNOSIS — D229 Melanocytic nevi, unspecified: Secondary | ICD-10-CM

## 2020-01-06 DIAGNOSIS — L570 Actinic keratosis: Secondary | ICD-10-CM

## 2020-01-06 DIAGNOSIS — Z1283 Encounter for screening for malignant neoplasm of skin: Secondary | ICD-10-CM

## 2020-01-06 DIAGNOSIS — D1800 Hemangioma unspecified site: Secondary | ICD-10-CM

## 2020-01-06 DIAGNOSIS — L578 Other skin changes due to chronic exposure to nonionizing radiation: Secondary | ICD-10-CM

## 2020-01-06 DIAGNOSIS — D492 Neoplasm of unspecified behavior of bone, soft tissue, and skin: Secondary | ICD-10-CM

## 2020-01-06 MED ORDER — KETOCONAZOLE 2 % EX SHAM: 1.0000 "application " | MEDICATED_SHAMPOO | CUTANEOUS | 11 refills | Status: DC

## 2020-01-06 NOTE — Progress Notes (Signed)
New Patient Visit  Subjective  Kristine Arroyo is a 44 y.o. female who presents for the following: Mole check (Total body skin exam, fhx of skin ca not sure type). The patient presents for Total-Body Skin Exam (TBSE) for skin cancer screening and mole check.  New patient referral from Kristine Arroyo, [name].  The following portions of the chart were reviewed this encounter and updated as appropriate:  Tobacco  Allergies  Meds  Problems  Med Hx  Surg Hx  Fam Hx      Review of Systems:  No other skin or systemic complaints except as noted in HPI or Assessment and Plan.  Objective  Well appearing patient in no apparent distress; mood and affect are within normal limits.  A full examination was performed including scalp, head, eyes, ears, nose, lips, neck, chest, axillae, abdomen, back, buttocks, bilateral upper extremities, bilateral lower extremities, hands, feet, fingers, toes, fingernails, and toenails. All findings within normal limits unless otherwise noted below.  Objective  R temple x 1: Pink scaly macules   Objective  back, chest, abdomen: Hypopigmented and hyperpigemented patches back, chest, abdomen  Objective  Left Ankle - Anterior: 0.5cm irregular brown macule   Assessment & Plan    Lentigines - Scattered tan macules - Discussed due to sun exposure - Benign, observe - Call for any changes  Seborrheic Keratoses - Stuck-on, waxy, tan-brown papules and plaques  - Discussed benign etiology and prognosis. - Observe - Call for any changes  Melanocytic Nevi - Tan-brown and/or pink-flesh-colored symmetric macules and papules - Benign appearing on exam today - Observation - Call clinic for new or changing moles - Recommend daily use of broad spectrum spf 30+ sunscreen to sun-exposed areas.   Hemangiomas - Red papules - Discussed benign nature - Observe - Call for any changes  Actinic Damage - Chronic, secondary to cumulative UV/sun exposure - diffuse  scaly erythematous macules with underlying dyspigmentation - Recommend daily broad spectrum sunscreen SPF 30+ to sun-exposed areas, reapply every 2 hours as needed.  - Call for new or changing lesions.  Skin cancer screening performed today.   AK (actinic keratosis) R temple x 1  Destruction of lesion - R temple x 1 Complexity: simple   Destruction method: cryotherapy   Informed consent: discussed and consent obtained   Timeout:  patient name, date of birth, surgical site, and procedure verified Lesion destroyed using liquid nitrogen: Yes   Region frozen until ice ball extended beyond lesion: Yes   Outcome: patient tolerated procedure well with no complications   Post-procedure details: wound care instructions given    Tinea versicolor - chronic recurrent condition back, chest, abdomen Start Ketoconazole 2% shampoo 3x/wk for 6 wks, let sit 5-5minutes and rinse off, then after 6 wks decrease to once monthly for maintenance   ketoconazole (NIZORAL) 2 % shampoo - back, chest, abdomen  Neoplasm of skin Left Ankle - Anterior  Epidermal / dermal shaving  Lesion diameter (cm):  0.5 Informed consent: discussed and consent obtained   Timeout: patient name, date of birth, surgical site, and procedure verified   Procedure prep:  Patient was prepped and draped in usual sterile fashion Prep type:  Isopropyl alcohol Anesthesia: the lesion was anesthetized in a standard fashion   Anesthetic:  1% lidocaine w/ epinephrine 1-100,000 buffered w/ 8.4% NaHCO3 Instrument used: flexible razor blade   Hemostasis achieved with: pressure, aluminum chloride and electrodesiccation   Outcome: patient tolerated procedure well   Post-procedure details: sterile dressing applied and  wound care instructions given   Dressing type: bandage and petrolatum    Specimen 1 - Surgical pathology Differential Diagnosis: D48.5 Nevus vs Dysplastic Nevus Check Margins: yes 0.5cm irregular brown macule  Return in  about 1 year (around 01/05/2021) for TBSE, hx of AK.   I, Kristine Arroyo, RMA, am acting as scribe for Kristine Ser, MD .  Documentation: I have reviewed the above documentation for accuracy and completeness, and I agree with the above.  Kristine Ser, MD

## 2020-01-06 NOTE — Patient Instructions (Addendum)
Actinic Keratosis  What is an actinic keratosis? An actinic keratosis (plural: actinic keratoses) is growth on the surface of the skin that usually appears as a red, hard, crusty or scaly bump.  What causes actinic keratoses? Repeated prolonged sun exposure causes skin damage, especially in fair-skinned persons. Sun-damaged skin becomes dry and wrinkled and may form rough, scaly spots called actinic keratoses. These rough spots remain on the skin even though the crust or scale on top is picked off.  Why treat actinic keratoses? Actinic keratoses are not skin cancers, but because they may sometimes turn cancerous they are called "pre-cancerous". Not all will turn to skin cancer, and it usually takes several years for this to happen. Because it is much easier to treat an actinic keratosis then it is to remove a skin cancer, actinic keratoses should be treated to prevent future skin cancer.  How are actinic keratoses treated? The most common way of treating actinic keratoses is to freeze them with liquid nitrogen. Freezing causes scabbing and shedding of the sun-damaged skin. Healing after a removal usually takes two weeks, depending on the size and location of the keratosis. Hands and legs heal more slowly than the face. The skin's final appearance is usually excellent. There are several topical medications that can be used to treat actinic keratoses. These medications generally have side effects of redness, crusting, and pain.Some are used for a few days, and some for several months before the actinic keratosis is completely gone. Photodynamic therapy is another alternative to freezing actinic keratoses.This treatment is done in a physician's office.A medication is applied to the area of skin with actinic keratoses, and it is allowed to soak in for one or more hours. A special light is then applied to the skin.Side effects include redness, burning, and peeling.  How can you prevent actinic  keratoses? Protection from the sun is the best way to prevent actinic keratoses.The use of proper clothing and sunscreens can prevent the sun damage that leads to an actinic keratosis. Unfortunately, some sun damage is permanent. Once sun damage has progressed to the point where actinic keratoses develop, new keratoses may appear even without further sun exposure. However, even in skin that is already heavily sun damaged, good sun protection can help reduce the number of actinic keratoses that will appear.     Tinea Versicolor  Tinea versicolor is a skin infection. It is caused by a type of yeast. It is normal for some yeast to be on your skin, but too much yeast causes this infection. The infection causes a rash of light or dark patches on your skin. The rash is most common on the chest, back, neck, or upper arms. The infection usually does not cause other problems. If it is treated, it will probably go away in a few weeks. The infection cannot be spread from one person to another (is not contagious). Follow these instructions at home:  Use over-the-counter and prescription medicines only as told by your doctor.  Scrub your skin every day with dandruff shampoo as told by your doctor.  Do not scratch your skin in the rash area.  Avoid places that are hot and humid.  Do not use tanning booths.  Try to avoid sweating a lot. Contact a doctor if:  Your symptoms get worse.  You have a fever.  You have redness, swelling, or pain in the rash area.  You have fluid or blood coming from your rash.  Your rash feels warm to the touch.  You have pus or a bad smell coming from your rash.  Your rash comes back (recurs) after treatment. Summary  Tinea versicolor is a skin infection. It causes a rash of light or dark patches on your skin.  The rash is most common on the chest, back, neck, or upper arms. This infection usually does not cause other problems.  Use over-the-counter and  prescription medicines only as told by your doctor.  If the infection is treated, it will probably go away in a few weeks. This information is not intended to replace advice given to you by your health care provider. Make sure you discuss any questions you have with your health care provider. Document Revised: 01/31/2017 Document Reviewed: 10/21/2016 Elsevier Patient Education  [place] Instructions  7. Cleanse wound gently with soap and water once a day then pat dry with clean gauze. Apply a thing coat of Petrolatum (petroleum jelly, "Vaseline") over the wound (unless you have an allergy to this). We recommend that you use a new, sterile tube of Vaseline. Do not pick or remove scabs. Do not remove the yellow or white "healing tissue" from the base of the wound.  8. Cover the wound with fresh, clean, nonstick gauze and secure with paper tape. You may use Band-Aids in place of gauze and tape if the would is small enough, but would recommend trimming much of the tape off as there is often too much. Sometimes Band-Aids can irritate the skin.  9. You should call the office for your biopsy report after 1 week if you have not already been contacted.  10. If you experience any problems, such as abnormal amounts of bleeding, swelling, significant bruising, significant pain, or evidence of infection, please call the office immediately.  11. FOR ADULT SURGERY PATIENTS: If you need something for pain relief you may take 1 extra strength Tylenol (acetaminophen) AND 2 Ibuprofen (200mg  each) together every 4 hours as needed for pain. (do not take these if you are allergic to them or if you have a reason you should not take them.) Typically, you may only need pain medication for 1 to 3 days.

## 2020-01-10 ENCOUNTER — Telehealth: Payer: Self-pay

## 2020-01-10 ENCOUNTER — Encounter: Payer: Self-pay | Admitting: Dermatology

## 2020-01-10 NOTE — Telephone Encounter (Signed)
Patient viewed benign bx results on MyChart. Called to see if she had any questions, JS

## 2020-01-10 NOTE — Telephone Encounter (Signed)
-----   Message from Ralene Bathe, MD sent at 01/07/2020  5:16 PM EDT ----- Diagnosis Skin , left ankle-anterior HEMANGIOMA  Benign hemangioma = collection of blood vessels No further treatment needed

## 2020-04-10 ENCOUNTER — Other Ambulatory Visit: Payer: Self-pay | Admitting: Obstetrics and Gynecology

## 2020-04-10 DIAGNOSIS — Z3490 Encounter for supervision of normal pregnancy, unspecified, unspecified trimester: Secondary | ICD-10-CM

## 2020-04-10 DIAGNOSIS — N96 Recurrent pregnancy loss: Secondary | ICD-10-CM

## 2020-05-01 ENCOUNTER — Encounter: Payer: Self-pay | Admitting: Obstetrics and Gynecology

## 2020-05-01 ENCOUNTER — Ambulatory Visit (INDEPENDENT_AMBULATORY_CARE_PROVIDER_SITE_OTHER): Payer: 59 | Admitting: Obstetrics and Gynecology

## 2020-05-01 ENCOUNTER — Other Ambulatory Visit: Payer: Self-pay

## 2020-05-01 ENCOUNTER — Ambulatory Visit: Payer: 59

## 2020-05-01 ENCOUNTER — Other Ambulatory Visit (HOSPITAL_COMMUNITY)
Admission: RE | Admit: 2020-05-01 | Discharge: 2020-05-01 | Disposition: A | Discharge status: Home / Self Care | Payer: 59 | Source: Ambulatory Visit | Attending: Obstetrics and Gynecology | Admitting: Obstetrics and Gynecology

## 2020-05-01 VITALS — BP 122/74 | Wt 158.0 lb

## 2020-05-01 DIAGNOSIS — O34219 Maternal care for unspecified type scar from previous cesarean delivery: Secondary | ICD-10-CM

## 2020-05-01 DIAGNOSIS — Z3490 Encounter for supervision of normal pregnancy, unspecified, unspecified trimester: Secondary | ICD-10-CM

## 2020-05-01 DIAGNOSIS — Z113 Encounter for screening for infections with a predominantly sexual mode of transmission: Secondary | ICD-10-CM | POA: Diagnosis present

## 2020-05-01 DIAGNOSIS — O09521 Supervision of elderly multigravida, first trimester: Secondary | ICD-10-CM

## 2020-05-01 DIAGNOSIS — O0991 Supervision of high risk pregnancy, unspecified, first trimester: Secondary | ICD-10-CM | POA: Insufficient documentation

## 2020-05-01 DIAGNOSIS — N96 Recurrent pregnancy loss: Secondary | ICD-10-CM

## 2020-05-01 DIAGNOSIS — Z124 Encounter for screening for malignant neoplasm of cervix: Secondary | ICD-10-CM

## 2020-05-01 DIAGNOSIS — Z369 Encounter for antenatal screening, unspecified: Secondary | ICD-10-CM

## 2020-05-01 DIAGNOSIS — Z8759 Personal history of other complications of pregnancy, childbirth and the puerperium: Secondary | ICD-10-CM

## 2020-05-01 DIAGNOSIS — Z3A09 9 weeks gestation of pregnancy: Secondary | ICD-10-CM

## 2020-05-01 NOTE — Progress Notes (Signed)
New Obstetric Patient H&P   Chief Complaint: "Desires prenatal care"   History of Present Illness: Patient is a 45 y.o. F7P1025 Not Hispanic or Latino female, certain LMP 85/27/7824 presents with amenorrhea and positive home pregnancy test. Based on her  LMP, her EDD is Estimated Date of Delivery: 12/03/20 and her EGA is [redacted]w[redacted]d. Cycles are regular, lasting 26 days.  Her last pap smear was 3 years ago and was no abnormalities.    She had a urine pregnancy test which was positive 4 week(s)  ago. Her last menstrual period was normal and lasted for  3 day(s). Since her LMP she claims she has experienced no issues. She denies vaginal bleeding. Her past medical history is notable for celiac disease. Her prior pregnancies are notable for gHTN with first pregnancy and a c-section delivery for fetal intolerance.   Since her LMP, she admits to the use of tobacco products  no She claims she has gained 4 pounds since the start of her pregnancy.  There are cats in the home in the home  yes If yes outdoor and husband cares for them. She admits close contact with children on a regular basis  yes  She has had chicken pox in the past yes She has had Tuberculosis exposures, symptoms, or previously tested positive for TB   no Current or past history of domestic violence. no  Genetic Screening/Teratology Counseling: (Includes patient, baby's father, or anyone in either family with:)   8. Patient's age >/= 13 [facility]  yes 2. Thalassemia ([place], [place], [place], or Asian background): MCV<80  no 3. Neural tube defect (meningomyelocele, spina bifida, anencephaly)  no 4. Congenital heart defect  no  5. Down syndrome  no 6. Tay-Sachs (Jewish, [place])  no 7. Canavan's Disease  no 8. Sickle cell disease or trait (African)  no  9. Hemophilia or other blood disorders  no  10. Muscular dystrophy  no  11. Cystic fibrosis  no  12. Huntington's Chorea  no  13. Mental retardation/autism  no 14. Other  inherited genetic or chromosomal disorder  no 15. Maternal metabolic disorder (DM, PKU, etc)  no 16. Patient or FOB with a child with a birth defect not listed above no  16a. Patient or FOB with a birth defect themselves no 17. Recurrent pregnancy loss, or stillbirth  yes  18. Any medications since LMP other than prenatal vitamins (include vitamins, supplements, OTC meds, drugs, alcohol)  no 19. Any other genetic/environmental exposure to discuss  no  Infection History:   1. Lives with someone with TB or TB exposed  no  2. Patient or partner has history of genital herpes  no 3. Rash or viral illness since LMP  no 4. History of STI (GC, CT, HPV, syphilis, HIV)  no 5. History of recent travel :  no  Other pertinent information:  no     Review of Systems:10 point review of systems negative unless otherwise noted in HPI  Past Medical History:  Diagnosis Date  . Celiac disease   . Gestational hypertension affecting third pregnancy     Past Surgical History:  Procedure Laterality Date  . CESAREAN SECTION N/A 05/03/2017   Procedure: CESAREAN SECTION;  Surgeon: Gae Dry, MD;  Location: [facility] ORS;  Service: Obstetrics;  Laterality: N/A;  . HERNIA REPAIR      Gynecologic History: Patient's last menstrual period was 02/27/2020.  Obstetric History: [id number]  Family History  Problem Relation Age of Onset  . Diabetes Mellitus  II Maternal Grandfather   . Throat cancer Maternal Grandfather   . Pancreatic cancer Paternal Grandmother   . Diabetes Mellitus II Maternal Grandmother     Social History   Socioeconomic History  . Marital status: Married    Spouse name: Larkin Ina  . Number of children: 1  . Years of education: 34  . Highest education level: Not on file  Occupational History  . Occupation: Careers adviser  Tobacco Use  . Smoking status: Former Research scientist (life sciences)  . Smokeless tobacco: Never Used  . Tobacco comment: stopped 2003  Vaping Use  . Vaping Use: Never used   Substance and Sexual Activity  . Alcohol use: Not Currently    Alcohol/week: 1.0 standard drink    Types: 1 Glasses of wine per week    Comment: 1/wk  . Drug use: No  . Sexual activity: Yes    Birth control/protection: None  Other Topics Concern  . Not on file  Social History Narrative  . Not on file   Social Determinants of Health   Financial Resource Strain: Not on file  Food Insecurity: Not on file  Transportation Needs: Not on file  Physical Activity: Not on file  Stress: Not on file  Social Connections: Not on file  Intimate Partner Violence: Not on file    Allergies  Allergen Reactions  . Gluten Meal Nausea And Vomiting    Compares to food poisoning    Prior to Admission medications   Medication Sig Start Date End Date Taking? Authorizing Provider  Prenatal Vit-Fe Fumarate-FA (PRENATAL VITAMIN PO) Take by mouth.   Yes [provider]  ketoconazole (NIZORAL) 2 % shampoo Apply 1 application topically as directed. Apply to scalp/trunk/arms 3 times weekly, let sit 5-10 minutes and rinse off, after 6 weeks decrease to once monthly for maintenance Patient not taking: Reported on 05/01/2020 01/06/20   Ralene Bathe, MD  OVER THE COUNTER MEDICATION Take 6 tablets by mouth daily. Multivitamin gummies Patient not taking: Reported on 05/01/2020 09/13/19   [provider]  Vitamin D, Ergocalciferol, (DRISDOL) 1.25 MG (50000 UNIT) CAPS capsule Take 50,000 Units by mouth daily. Patient not taking: Reported on 05/01/2020    [provider]    Physical Exam BP 122/74   Wt 158 lb (71.7 kg)   LMP 02/27/2020   BMI 25.50 kg/m   Physical Exam Constitutional:      General: She is not in acute distress.    Appearance: Normal appearance. She is well-developed.  Genitourinary:     Vulva, bladder and urethral meatus normal.     Right Labia: No rash, tenderness, lesions, skin changes or Bartholin's cyst.    Left Labia: No tenderness, skin changes,  Bartholin's cyst or rash.    No inguinal adenopathy present in the right or left side.    Pelvic Tanner Score: 5/5.     Right Adnexa: not tender, not full and no mass present.    Left Adnexa: not tender, not full and no mass present.    No cervical motion tenderness, friability, lesion or polyp.     Uterus is not enlarged, fixed or tender.     Uterus is anteverted.     No urethral tenderness or mass present.     Pelvic exam was performed with patient in the lithotomy position.  HENT:     Head: Normocephalic and atraumatic.  Eyes:     General: No scleral icterus.    Conjunctiva/sclera: Conjunctivae normal.  Cardiovascular:  Rate and Rhythm: Normal rate and regular rhythm.     Heart sounds: No murmur heard. No friction rub. No gallop.   Pulmonary:     Effort: Pulmonary effort is normal. No respiratory distress.     Breath sounds: Normal breath sounds. No wheezing or rales.  Abdominal:     General: Bowel sounds are normal. There is no distension.     Palpations: Abdomen is soft. There is no mass.     Tenderness: There is no abdominal tenderness. There is no guarding or rebound.     Hernia: There is no hernia in the left inguinal area or right inguinal area.  Musculoskeletal:        General: Normal range of motion.     Cervical back: Normal range of motion and neck supple.  Lymphadenopathy:     Lower Body: No right inguinal adenopathy. No left inguinal adenopathy.  Neurological:     General: No focal deficit present.     Mental Status: She is alert and oriented to person, place, and time.     Cranial Nerves: No cranial nerve deficit.  Skin:    General: Skin is warm and dry.     Findings: No erythema.  Psychiatric:        Mood and Affect: Mood normal.        Behavior: Behavior normal.        Judgment: Judgment normal.      Female Chaperone present during breast and/or pelvic exam.  Bedside pelvic transabdominal ultrasound: Single living intrauterine pregnancy  measuring 2.32 cm CRL, consistent with 101w0d gestational age and EDD of 12/04/2020. No cervical or adnexal abnormalities noted Cardiac activity: 178 bpm   Assessment: 45 y.o. [id number] at [redacted]w[redacted]d presenting to initiate prenatal care  Plan: 1) Avoid alcoholic beverages. 2) Patient encouraged not to smoke.  3) Discontinue the use of all non-medicinal drugs and chemicals.  4) Take prenatal vitamins daily.  5) Nutrition, food safety (fish, cheese advisories, and high nitrite foods) and exercise discussed. 6) Hospital and practice style discussed with cross coverage system.  7) Genetic Screening, such as with 1st Trimester Screening, cell free fetal DNA, AFP testing, and Ultrasound, as well as with amniocentesis and CVS as appropriate, is discussed with patient. At the conclusion of today's visit patient requested genetic testing 8) Patient is asked about travel to areas at risk for the Zika virus, and counseled to avoid travel and exposure to mosquitoes or sexual partners who may have themselves been exposed to the virus. Testing is discussed, and will be ordered as appropriate.   Prentice Docker, MD 05/01/2020 9:21 AM

## 2020-05-03 LAB — CYTOLOGY - PAP
Chlamydia: NEGATIVE
Comment: NEGATIVE
Comment: NEGATIVE
Comment: NORMAL
Diagnosis: NEGATIVE
High risk HPV: NEGATIVE
Neisseria Gonorrhea: NEGATIVE

## 2020-05-03 LAB — URINE DRUG PANEL 7
Amphetamines, Urine: NEGATIVE ng/mL
Barbiturate Quant, Ur: NEGATIVE ng/mL
Benzodiazepine Quant, Ur: NEGATIVE ng/mL
Cannabinoid Quant, Ur: NEGATIVE ng/mL
Cocaine (Metab.): NEGATIVE ng/mL
Opiate Quant, Ur: NEGATIVE ng/mL
PCP Quant, Ur: NEGATIVE ng/mL

## 2020-05-03 LAB — URINE CULTURE

## 2020-05-08 ENCOUNTER — Other Ambulatory Visit: Payer: Self-pay

## 2020-05-08 ENCOUNTER — Other Ambulatory Visit: Payer: 59

## 2020-05-08 DIAGNOSIS — Z113 Encounter for screening for infections with a predominantly sexual mode of transmission: Secondary | ICD-10-CM

## 2020-05-08 DIAGNOSIS — O0991 Supervision of high risk pregnancy, unspecified, first trimester: Secondary | ICD-10-CM

## 2020-05-09 LAB — RPR+RH+ABO+RUB AB+AB SCR+CB...
Antibody Screen: NEGATIVE
HIV Screen 4th Generation wRfx: NONREACTIVE
Hematocrit: 35.4 % (ref 34.0–46.6)
Hemoglobin: 12.2 g/dL (ref 11.1–15.9)
Hepatitis B Surface Ag: NEGATIVE
MCH: 30.6 pg (ref 26.6–33.0)
MCHC: 34.5 g/dL (ref 31.5–35.7)
MCV: 89 fL (ref 79–97)
Platelets: 177 10*3/uL (ref 150–450)
RBC: 3.99 x10E6/uL (ref 3.77–5.28)
RDW: 12.5 % (ref 11.7–15.4)
RPR Ser Ql: NONREACTIVE
Rh Factor: POSITIVE
Rubella Antibodies, IGG: 4.02 index (ref >0.99)
Varicella zoster IgG: 953 index (ref >165)
WBC: 5.9 10*3/uL (ref 3.4–10.8)

## 2020-05-11 ENCOUNTER — Other Ambulatory Visit: Payer: Self-pay | Admitting: Obstetrics and Gynecology

## 2020-05-11 DIAGNOSIS — O09521 Supervision of elderly multigravida, first trimester: Secondary | ICD-10-CM

## 2020-05-11 DIAGNOSIS — Z1379 Encounter for other screening for genetic and chromosomal anomalies: Secondary | ICD-10-CM

## 2020-05-11 DIAGNOSIS — Z3A10 10 weeks gestation of pregnancy: Secondary | ICD-10-CM

## 2020-05-11 DIAGNOSIS — O0991 Supervision of high risk pregnancy, unspecified, first trimester: Secondary | ICD-10-CM

## 2020-05-12 ENCOUNTER — Other Ambulatory Visit: Payer: 59

## 2020-05-12 ENCOUNTER — Other Ambulatory Visit: Payer: Self-pay

## 2020-05-12 DIAGNOSIS — O09521 Supervision of elderly multigravida, first trimester: Secondary | ICD-10-CM

## 2020-05-12 DIAGNOSIS — Z1379 Encounter for other screening for genetic and chromosomal anomalies: Secondary | ICD-10-CM

## 2020-05-12 DIAGNOSIS — O0991 Supervision of high risk pregnancy, unspecified, first trimester: Secondary | ICD-10-CM

## 2020-05-12 DIAGNOSIS — Z3A10 10 weeks gestation of pregnancy: Secondary | ICD-10-CM

## 2020-05-19 LAB — MATERNIT 21 PLUS CORE, BLOOD
Fetal Fraction: 11
Result (T21): NEGATIVE
Trisomy 13 (Patau syndrome): NEGATIVE
Trisomy 18 (Edwards syndrome): NEGATIVE
Trisomy 21 (Down syndrome): NEGATIVE

## 2020-05-29 ENCOUNTER — Encounter: Payer: Self-pay | Admitting: Obstetrics & Gynecology

## 2020-05-29 ENCOUNTER — Other Ambulatory Visit: Payer: Self-pay

## 2020-05-29 ENCOUNTER — Ambulatory Visit (INDEPENDENT_AMBULATORY_CARE_PROVIDER_SITE_OTHER): Payer: 59 | Admitting: Obstetrics & Gynecology

## 2020-05-29 VITALS — BP 120/80 | Wt 158.0 lb

## 2020-05-29 DIAGNOSIS — O0990 Supervision of high risk pregnancy, unspecified, unspecified trimester: Secondary | ICD-10-CM

## 2020-05-29 DIAGNOSIS — Z3A13 13 weeks gestation of pregnancy: Secondary | ICD-10-CM

## 2020-05-29 DIAGNOSIS — O34219 Maternal care for unspecified type scar from previous cesarean delivery: Secondary | ICD-10-CM

## 2020-05-29 DIAGNOSIS — Z3689 Encounter for other specified antenatal screening: Secondary | ICD-10-CM

## 2020-05-29 DIAGNOSIS — O09521 Supervision of elderly multigravida, first trimester: Secondary | ICD-10-CM

## 2020-05-29 DIAGNOSIS — O0991 Supervision of high risk pregnancy, unspecified, first trimester: Secondary | ICD-10-CM

## 2020-05-29 LAB — POCT URINALYSIS DIPSTICK OB
Glucose, UA: NEGATIVE
POC,PROTEIN,UA: NEGATIVE

## 2020-05-29 NOTE — Progress Notes (Signed)
Subjective  No nausea, pian, bleeding Prior CS discussed, desired repeat w BTL Plans to breast feed  Objective  BP 120/80   Wt 158 lb (71.7 kg)   LMP 02/27/2020   BMI 25.50 kg/m  General: NAD Pumonary: no increased work of breathing Abdomen: gravid, non-tender Extremities: no edema Psychiatric: mood appropriate, affect full  Assessment  45 y.o. [id number] at [redacted]w[redacted]d by  12/03/2020, by Last Menstrual Period presenting for routine prenatal visit  Plan   Problem List Items Addressed This Visit      Other   Supervision of high risk pregnancy, antepartum   History of cesarean delivery affecting pregnancy   Advanced maternal age in multigravida   Relevant Orders   [place] MFM OB COMP + 48 WK    Other Visit Diagnoses    [redacted] weeks gestation of pregnancy    -  Primary   Supervision of high risk pregnancy in first trimester       Relevant Orders   [place] MFM OB COMP + 45 WK   Screening, antenatal, for fetal anatomic survey       Relevant Orders   [place] MFM OB COMP + 90 WK    PNV [place] anat at MFM at 19 weeks Breast feeding discussed Plans BTL w CS (desires repeat CS over VBAC)  pregnancy Problems (from 05/01/20 to present)    Problem Noted Resolved   Supervision of high risk pregnancy, antepartum 02/18/2019 by Will Bonnet, MD No   Overview Addendum 05/29/2020  9:36 AM by Gae Dry, MD    [facility] Prenatal Labs  Dating LMPm confirmed by 7 wk [place] Blood type: O/Positive/-- (03/07 0851)   Genetic Screen    NIPS: nml XY Antibody:Negative (03/07 0851)  Anatomic [place]  Rubella: 4.02 (03/07 0851) Varicella: Immune  GTT Third trimester:  RPR: Non Reactive (03/07 0851)   Rhogam n/a HBsAg: Negative (03/07 0851)   TDaP vaccine                   Flu Shot: HIV: Non Reactive (03/07 0851)   Baby Food  Breast                              GBS:   Contraception  BTL Pap:05/01/20  CBB  no   CS/VBAC  R CS desired   Support Person husb           Previous Version   History of cesarean  delivery affecting pregnancy     Overview Addendum 05/29/2020  9:37 AM by Gae Dry, MD     - due to fetal intolerance of labor - desires repeat c-section - 05/29/20 OB/GYN  Counseling Note 45 y.o. [id number] at [redacted]w[redacted]d with Estimated Date of Delivery: 12/03/20 was seen today in office to discuss trial of labor after cesarean section (TOLAC) versus elective repeat cesarean delivery (ERCD). The following risks were discussed with the patient.  Risk of uterine rupture at term is 0.78 percent with TOLAC and 0.22 percent with ERCD. 1 in 10 uterine ruptures will result in neonatal death or neurological injury. The benefits of a trial of labor after cesarean (TOLAC) resulting in a vaginal birth after cesarean (VBAC) include the following: shorter length of hospital stay and postpartum recovery (in most cases); fewer complications, such as postpartum fever, wound or uterine infection, thromboembolism (blood clots in the leg or lung), need for blood transfusion and fewer neonatal breathing problems.  The risks of an attempted VBAC or TOLAC include the following: Risk of failed trial of labor after cesarean (TOLAC) without a vaginal birth after cesarean (VBAC) resulting in repeat cesarean delivery (RCD) in about 20 to 44 percent of women who attempt VBAC.  Risk of rupture of uterus resulting in an emergency cesarean delivery. The risk of uterine rupture may be related in part to the type of uterine incision made during the first cesarean delivery. A previous transverse uterine incision has the lowest risk of rupture (0.2 to 1.5 percent risk). Vertical or T-shaped uterine incisions have a higher risk of uterine rupture (4 to 9 percent risk)The risk of fetal death is very low with both VBAC and elective repeat cesarean delivery (ERCD), but the likelihood of fetal death is higher with VBAC than with ERCD. Maternal death is very rare with either type of delivery.  The risks of an elective repeat cesarean delivery  (ERCD) were reviewed with the patient including but not limited to: 04/998 risk of uterine rupture which could have serious consequences, bleeding which may require transfusion; infection which may require antibiotics; injury to bowel, bladder or other surrounding organs (bowel, bladder, ureters); injury to the fetus; need for additional procedures including hysterectomy in the event of a life-threatening hemorrhage; thromboembolic phenomenon; abnormal placentation; incisional problems; death and other postoperative or anesthesia complications.          Previous Version   History of gestational hypertension     Advanced maternal age in multigravida     Overview Signed 05/29/2020  9:37 AM by Gae Dry, MD    NIPT nml MFM [place] for anatomy check [ ]           [name], MD, [facility], [facility] 05/29/2020  9:38 AM

## 2020-05-29 NOTE — Patient Instructions (Signed)
Thank you for choosing [facility]. As part of our ongoing efforts to improve patient experience, we would appreciate your feedback. Please fill out the short survey that you will receive by mail or MyChart. Your opinion is important to [place]! -Dr Kenton Kingfisher  Second Trimester of Pregnancy  The second trimester of pregnancy is from week 13 through week 27. This is months 4 through 6 of pregnancy. The second trimester is often a time when you feel your best. Your body has adjusted to being pregnant, and you begin to feel better physically. During the second trimester:  Morning sickness has lessened or stopped completely.  You may have more energy.  You may have an increase in appetite. The second trimester is also a time when the unborn baby (fetus) is growing rapidly. At the end of the sixth month, the fetus may be up to 12 inches long and weigh about 1 pounds. You will likely begin to feel the baby move (quickening) between 16 and 20 weeks of pregnancy. Body changes during your second trimester Your body continues to go through many changes during your second trimester. The changes vary and generally return to normal after the baby is born. Physical changes  Your weight will continue to increase. You will notice your lower abdomen bulging out.  You may begin to get stretch marks on your hips, abdomen, and breasts.  Your breasts will continue to grow and to become tender.  Dark spots or blotches (chloasma or mask of pregnancy) may develop on your face.  A dark line from your belly button to the pubic area (linea nigra) may appear.  You may have changes in your hair. These can include thickening of your hair, rapid growth, and changes in texture. Some people also have hair loss during or after pregnancy, or hair that feels dry or thin. Health changes  You may develop headaches.  You may have heartburn.  You may develop constipation.  You may develop hemorrhoids or swollen, bulging  veins (varicose veins).  Your gums may bleed and may be sensitive to brushing and flossing.  You may urinate more often because the fetus is pressing on your bladder.  You may have back pain. This is caused by: ? Weight gain. ? Pregnancy hormones that are relaxing the joints in your pelvis. ? A shift in weight and the muscles that support your balance. Follow these instructions at home: Medicines  Follow your health care provider's instructions regarding medicine use. Specific medicines may be either safe or unsafe to take during pregnancy. Do not take any medicines unless approved by your health care provider.  Take a prenatal vitamin that contains at least 600 micrograms (mcg) of folic acid. Eating and drinking  Eat a healthy diet that includes fresh fruits and vegetables, whole grains, good sources of protein such as meat, eggs, or tofu, and low-fat dairy products.  Avoid raw meat and unpasteurized juice, milk, and cheese. These carry germs that can harm you and your baby.  You may need to take these actions to prevent or treat constipation: ? Drink enough fluid to keep your urine pale yellow. ? Eat foods that are high in fiber, such as beans, whole grains, and fresh fruits and vegetables. ? Limit foods that are high in fat and processed sugars, such as fried or sweet foods. Activity  Exercise only as directed by your health care provider. Most people can continue their usual exercise routine during pregnancy. Try to exercise for 30 minutes at least  5 days a week. Stop exercising if you develop contractions in your uterus.  Stop exercising if you develop pain or cramping in the lower abdomen or lower back.  Avoid exercising if it is very hot or humid or if you are at a high altitude.  Avoid heavy lifting.  If you choose to, you may have sex unless your health care provider tells you not to. Relieving pain and discomfort  Wear a supportive bra to prevent discomfort from  breast tenderness.  Take warm sitz baths to soothe any pain or discomfort caused by hemorrhoids. Use hemorrhoid cream if your health care provider approves.  Rest with your legs raised (elevated) if you have leg cramps or low back pain.  If you develop varicose veins: ? Wear support hose as told by your health care provider. ? Elevate your feet for 15 minutes, 3-4 times a day. ? Limit salt in your diet. Safety  Wear your seat belt at all times when driving or riding in a car.  Talk with your health care provider if someone is verbally or physically abusive to you. Lifestyle  Do not use hot tubs, steam rooms, or saunas.  Do not douche. Do not use tampons or scented sanitary pads.  Avoid cat litter boxes and soil used by cats. These carry germs that can cause birth defects in the baby and possibly loss of the fetus by miscarriage or stillbirth.  Do not use herbal remedies, alcohol, illegal drugs, or medicines that are not approved by your health care provider. Chemicals in these products can harm your baby.  Do not use any products that contain nicotine or tobacco, such as cigarettes, e-cigarettes, and chewing tobacco. If you need help quitting, ask your health care provider. General instructions  During a routine prenatal visit, your health care provider will do a physical exam and other tests. He or she will also discuss your overall health. Keep all follow-up visits. This is important.  Ask your health care provider for a referral to a local prenatal education class.  Ask for help if you have counseling or nutritional needs during pregnancy. Your health care provider can offer advice or refer you to specialists for help with various needs. Where to find more information  American Pregnancy Association: americanpregnancy.[place] and Gynecologists: PoolDevices.com.pt  Office on [employer]: KeywordPortfolios.com.br Contact  a health care provider if you have:  A headache that does not go away when you take medicine.  Vision changes or you see spots in front of your eyes.  Mild pelvic cramps, pelvic pressure, or nagging pain in the abdominal area.  Persistent nausea, vomiting, or diarrhea.  A bad-smelling vaginal discharge or foul-smelling urine.  Pain when you urinate.  Sudden or extreme swelling of your face, hands, ankles, feet, or legs.  A fever. Get help right away if you:  Have fluid leaking from your vagina.  Have spotting or bleeding from your vagina.  Have severe abdominal cramping or pain.  Have difficulty breathing.  Have chest pain.  Have fainting spells.  Have not felt your baby move for the time period told by your health care provider.  Have new or increased pain, swelling, or redness in an arm or leg. Summary  The second trimester of pregnancy is from week 13 through week 27 (months 4 through 6).  Do not use herbal remedies, alcohol, illegal drugs, or medicines that are not approved by your health care provider. Chemicals in these products can  harm your baby.  Exercise only as directed by your health care provider. Most people can continue their usual exercise routine during pregnancy.  Keep all follow-up visits. This is important. This information is not intended to replace advice given to you by your health care provider. Make sure you discuss any questions you have with your health care provider. Document Revised: 07/28/2019 Document Reviewed: 06/03/2019 Elsevier Patient Education  2021 [employer].

## 2020-05-29 NOTE — Addendum Note (Signed)
Addended by: Quintella Baton D on: 05/29/2020 09:43 AM   Modules accepted: Orders

## 2020-06-26 ENCOUNTER — Encounter: Payer: Self-pay | Admitting: Obstetrics and Gynecology

## 2020-06-26 ENCOUNTER — Other Ambulatory Visit: Payer: Self-pay

## 2020-06-26 ENCOUNTER — Ambulatory Visit (INDEPENDENT_AMBULATORY_CARE_PROVIDER_SITE_OTHER): Payer: 59 | Admitting: Obstetrics and Gynecology

## 2020-06-26 VITALS — BP 118/74 | Wt 160.0 lb

## 2020-06-26 DIAGNOSIS — O0992 Supervision of high risk pregnancy, unspecified, second trimester: Secondary | ICD-10-CM

## 2020-06-26 DIAGNOSIS — Z8759 Personal history of other complications of pregnancy, childbirth and the puerperium: Secondary | ICD-10-CM

## 2020-06-26 DIAGNOSIS — O09522 Supervision of elderly multigravida, second trimester: Secondary | ICD-10-CM

## 2020-06-26 DIAGNOSIS — Z3A17 17 weeks gestation of pregnancy: Secondary | ICD-10-CM

## 2020-06-26 DIAGNOSIS — O34219 Maternal care for unspecified type scar from previous cesarean delivery: Secondary | ICD-10-CM

## 2020-06-26 NOTE — Progress Notes (Signed)
Routine Prenatal Care Visit  Subjective  Kristine Arroyo is a 45 y.o. [phone] at [redacted]w[redacted]d being seen today for ongoing prenatal care.  She is currently monitored for the following issues for this high-risk pregnancy and has Celiac disease; Lumbago; Supervision of high risk pregnancy, antepartum; History of cesarean delivery affecting pregnancy; History of gestational hypertension; and Advanced maternal age in multigravida on their problem list.  ----------------------------------------------------------------------------------- Patient reports no complaints.    . Vag. Bleeding: None.  Movement: Present. Leaking Fluid denies.  ----------------------------------------------------------------------------------- The following portions of the patient's history were reviewed and updated as appropriate: allergies, current medications, past family history, past medical history, past social history, past surgical history and problem list. Problem list updated.  Objective  Blood pressure 118/74, weight 160 lb (72.6 kg), last menstrual period 02/27/2020, unknown if currently breastfeeding. Pregravid weight 158 lb (71.7 kg) Total Weight Gain 2 lb (0.907 kg) Urinalysis: Urine Protein    Urine Glucose    Fetal Status: Fetal Heart Rate (bpm): 150   Movement: Present     General:  Alert, oriented and cooperative. Patient is in no acute distress.  Skin: Skin is warm and dry. No rash noted.   Cardiovascular: Normal heart rate noted  Respiratory: Normal respiratory effort, no problems with respiration noted  Abdomen: Soft, gravid, appropriate for gestational age. Pain/Pressure: Absent     Pelvic:  Cervical exam deferred        Extremities: Normal range of motion.  Edema: None  Mental Status: Normal mood and affect. Normal behavior. Normal judgment and thought content.   Assessment   45 y.o. [id number] at [redacted]w[redacted]d by  12/03/2020, by Last Menstrual Period presenting for routine prenatal visit  Plan   pregnancy  Problems (from 05/01/20 to present)    Problem Noted Resolved   Supervision of high risk pregnancy, antepartum 02/18/2019 by Will Bonnet, MD No   Overview Addendum 05/29/2020  9:36 AM by Gae Dry, MD    [facility] Prenatal Labs  Dating LMPm confirmed by 7 wk [place] Blood type: O/Positive/-- (03/07 0851)   Genetic Screen    NIPS: nml XY Antibody:Negative (03/07 0851)  Anatomic [place]  Rubella: 4.02 (03/07 0851) Varicella: Immune  GTT Third trimester:  RPR: Non Reactive (03/07 0851)   Rhogam n/a HBsAg: Negative (03/07 0851)   TDaP vaccine                   Flu Shot: HIV: Non Reactive (03/07 0851)   Baby Food  Breast                              GBS:   Contraception  BTL Pap:05/01/20  CBB  no   CS/VBAC  R CS desired   Support Person husb           Previous Version   History of cesarean delivery affecting pregnancy 02/18/2019 by Will Bonnet, MD No   Overview Addendum 05/29/2020  9:37 AM by Gae Dry, MD     - due to fetal intolerance of labor - desires repeat c-section - 05/29/20 OB/GYN  Counseling Note 45 y.o. [id number] at [redacted]w[redacted]d with Estimated Date of Delivery: 12/03/20 was seen today in office to discuss trial of labor after cesarean section (TOLAC) versus elective repeat cesarean delivery (ERCD). The following risks were discussed with the patient.  Risk of uterine rupture at term is 0.78 percent with TOLAC and 0.22 percent with ERCD. 1  in 10 uterine ruptures will result in neonatal death or neurological injury. The benefits of a trial of labor after cesarean (TOLAC) resulting in a vaginal birth after cesarean (VBAC) include the following: shorter length of hospital stay and postpartum recovery (in most cases); fewer complications, such as postpartum fever, wound or uterine infection, thromboembolism (blood clots in the leg or lung), need for blood transfusion and fewer neonatal breathing problems.  The risks of an attempted VBAC or TOLAC include the following: Risk  of failed trial of labor after cesarean (TOLAC) without a vaginal birth after cesarean (VBAC) resulting in repeat cesarean delivery (RCD) in about 20 to 73 percent of women who attempt VBAC.  Risk of rupture of uterus resulting in an emergency cesarean delivery. The risk of uterine rupture may be related in part to the type of uterine incision made during the first cesarean delivery. A previous transverse uterine incision has the lowest risk of rupture (0.2 to 1.5 percent risk). Vertical or T-shaped uterine incisions have a higher risk of uterine rupture (4 to 9 percent risk)The risk of fetal death is very low with both VBAC and elective repeat cesarean delivery (ERCD), but the likelihood of fetal death is higher with VBAC than with ERCD. Maternal death is very rare with either type of delivery.  The risks of an elective repeat cesarean delivery (ERCD) were reviewed with the patient including but not limited to: 04/998 risk of uterine rupture which could have serious consequences, bleeding which may require transfusion; infection which may require antibiotics; injury to bowel, bladder or other surrounding organs (bowel, bladder, ureters); injury to the fetus; need for additional procedures including hysterectomy in the event of a life-threatening hemorrhage; thromboembolic phenomenon; abnormal placentation; incisional problems; death and other postoperative or anesthesia complications.          Previous Version   History of gestational hypertension 02/18/2019 by Will Bonnet, MD No   Advanced maternal age in multigravida 02/18/2019 by Will Bonnet, MD No   Overview Signed 05/29/2020  9:37 AM by Gae Dry, MD    NIPT nml MFM [place] for anatomy check [ ]           Preterm labor symptoms and general obstetric precautions including but not limited to vaginal bleeding, contractions, leaking of fluid and fetal movement were reviewed in detail with the patient. Please refer to After Visit  Summary for other counseling recommendations.   Return in about 4 weeks (around 07/24/2020) for Routine Prenatal Appointment.   Prentice Docker, MD, [facility], [facility] 06/26/2020 8:34 AM

## 2020-06-27 ENCOUNTER — Ambulatory Visit: Payer: 59

## 2020-06-30 NOTE — Telephone Encounter (Signed)
Called and left voicemail for patient to call back to be scheduled. 

## 2020-06-30 NOTE — Telephone Encounter (Signed)
Glennon Mac: Your patient has this question.  Rita: Can you schedule her for a nurse visit.

## 2020-07-20 ENCOUNTER — Other Ambulatory Visit: Payer: Self-pay

## 2020-07-20 ENCOUNTER — Ambulatory Visit: Payer: 59 | Attending: Obstetrics and Gynecology

## 2020-07-20 DIAGNOSIS — O0991 Supervision of high risk pregnancy, unspecified, first trimester: Secondary | ICD-10-CM

## 2020-07-20 DIAGNOSIS — O09522 Supervision of elderly multigravida, second trimester: Secondary | ICD-10-CM | POA: Diagnosis not present

## 2020-07-20 DIAGNOSIS — Z3689 Encounter for other specified antenatal screening: Secondary | ICD-10-CM

## 2020-07-20 DIAGNOSIS — Z3A20 20 weeks gestation of pregnancy: Secondary | ICD-10-CM | POA: Insufficient documentation

## 2020-07-20 DIAGNOSIS — O09521 Supervision of elderly multigravida, first trimester: Secondary | ICD-10-CM

## 2020-07-20 DIAGNOSIS — O0992 Supervision of high risk pregnancy, unspecified, second trimester: Secondary | ICD-10-CM | POA: Insufficient documentation

## 2020-07-24 ENCOUNTER — Encounter: Payer: Self-pay | Admitting: Advanced Practice Midwife

## 2020-07-24 ENCOUNTER — Other Ambulatory Visit: Payer: Self-pay

## 2020-07-24 ENCOUNTER — Ambulatory Visit (INDEPENDENT_AMBULATORY_CARE_PROVIDER_SITE_OTHER): Payer: 59 | Admitting: Advanced Practice Midwife

## 2020-07-24 VITALS — BP 110/70 | Wt 164.0 lb

## 2020-07-24 DIAGNOSIS — O0992 Supervision of high risk pregnancy, unspecified, second trimester: Secondary | ICD-10-CM

## 2020-07-24 DIAGNOSIS — Z369 Encounter for antenatal screening, unspecified: Secondary | ICD-10-CM

## 2020-07-24 DIAGNOSIS — Z3A21 21 weeks gestation of pregnancy: Secondary | ICD-10-CM

## 2020-07-24 DIAGNOSIS — N390 Urinary tract infection, site not specified: Secondary | ICD-10-CM

## 2020-07-24 LAB — POCT URINALYSIS DIPSTICK
Bilirubin, UA: NEGATIVE
Blood, UA: NEGATIVE
Glucose, UA: NEGATIVE
Ketones, UA: NEGATIVE
Nitrite, UA: NEGATIVE
Protein, UA: NEGATIVE
Spec Grav, UA: 1.01 (ref 1.010–1.025)
Urobilinogen, UA: 0.2 E.U./dL
pH, UA: 5 (ref 5.0–8.0)

## 2020-07-24 NOTE — Addendum Note (Signed)
Addended by: Rod Can on: 07/24/2020 08:50 AM   Modules accepted: Orders

## 2020-07-24 NOTE — Progress Notes (Addendum)
Routine Prenatal Care Visit  Subjective  Kristine Arroyo is a 45 y.o. [phone] at [redacted]w[redacted]d being seen today for ongoing prenatal care.  She is currently monitored for the following issues for this high-risk pregnancy and has Celiac disease; Lumbago; Supervision of high risk pregnancy, antepartum; History of cesarean delivery affecting pregnancy; History of gestational hypertension; and Advanced maternal age in multigravida on their problem list.  ----------------------------------------------------------------------------------- Patient reports no complaints.  She mentions a stressful time at work and hopes that her blood pressure will stay normal as she had gestational hypertension with previous pregnancy. We reviewed stress reducing methods and testing as needed. She had UTI symptoms last week and treated at home with D Mannose and cranberry. No symptoms now. Will check urine culture.  . Vag. Bleeding: None.  Movement: Present. Leaking Fluid denies.  ----------------------------------------------------------------------------------- The following portions of the patient's history were reviewed and updated as appropriate: allergies, current medications, past family history, past medical history, past social history, past surgical history and problem list. Problem list updated.  Objective  Blood pressure 110/70, weight 164 lb (74.4 kg), last menstrual period 02/27/2020, unknown if currently breastfeeding. Pregravid weight 158 lb (71.7 kg) Total Weight Gain 6 lb (2.722 kg) Urinalysis: Urine Protein    Urine Glucose    Fetal Status: Fetal Heart Rate (bpm): 152 Fundal Height: 21 cm Movement: Present     General:  Alert, oriented and cooperative. Patient is in no acute distress.  Skin: Skin is warm and dry. No rash noted.   Cardiovascular: Normal heart rate noted  Respiratory: Normal respiratory effort, no problems with respiration noted  Abdomen: Soft, gravid, appropriate for gestational age.  Pain/Pressure: Absent     Pelvic:  Cervical exam deferred        Extremities: Normal range of motion.     Mental Status: Normal mood and affect. Normal behavior. Normal judgment and thought content.   Assessment   45 y.o. [id number] at [redacted]w[redacted]d by  12/03/2020, by Last Menstrual Period presenting for routine prenatal visit  Plan   pregnancy Problems (from 05/01/20 to present)    Problem Noted Resolved   Supervision of high risk pregnancy, antepartum 02/18/2019 by Will Bonnet, MD No   Overview Addendum 05/29/2020  9:36 AM by Gae Dry, MD    [facility] Prenatal Labs  Dating LMPm confirmed by 7 wk [place] Blood type: O/Positive/-- (03/07 0851)   Genetic Screen    NIPS: nml XY Antibody:Negative (03/07 0851)  Anatomic [place]  Rubella: 4.02 (03/07 0851) Varicella: Immune  GTT Third trimester:  RPR: Non Reactive (03/07 0851)   Rhogam n/a HBsAg: Negative (03/07 0851)   TDaP vaccine                   Flu Shot: HIV: Non Reactive (03/07 0851)   Baby Food  Breast                              GBS:   Contraception  BTL Pap:05/01/20  CBB  no   CS/VBAC  R CS desired   Support Person husb           Previous Version   History of cesarean delivery affecting pregnancy 02/18/2019 by Will Bonnet, MD No   Overview Addendum 05/29/2020  9:37 AM by Gae Dry, MD     - due to fetal intolerance of labor - desires repeat c-section - 05/29/20 OB/GYN  Counseling Note 44  y.o. [id number] at [redacted]w[redacted]d with Estimated Date of Delivery: 12/03/20 was seen today in office to discuss trial of labor after cesarean section (TOLAC) versus elective repeat cesarean delivery (ERCD). The following risks were discussed with the patient.  Risk of uterine rupture at term is 0.78 percent with TOLAC and 0.22 percent with ERCD. 1 in 10 uterine ruptures will result in neonatal death or neurological injury. The benefits of a trial of labor after cesarean (TOLAC) resulting in a vaginal birth after cesarean (VBAC) include the  following: shorter length of hospital stay and postpartum recovery (in most cases); fewer complications, such as postpartum fever, wound or uterine infection, thromboembolism (blood clots in the leg or lung), need for blood transfusion and fewer neonatal breathing problems.  The risks of an attempted VBAC or TOLAC include the following: Risk of failed trial of labor after cesarean (TOLAC) without a vaginal birth after cesarean (VBAC) resulting in repeat cesarean delivery (RCD) in about 20 to 77 percent of women who attempt VBAC.  Risk of rupture of uterus resulting in an emergency cesarean delivery. The risk of uterine rupture may be related in part to the type of uterine incision made during the first cesarean delivery. A previous transverse uterine incision has the lowest risk of rupture (0.2 to 1.5 percent risk). Vertical or T-shaped uterine incisions have a higher risk of uterine rupture (4 to 9 percent risk)The risk of fetal death is very low with both VBAC and elective repeat cesarean delivery (ERCD), but the likelihood of fetal death is higher with VBAC than with ERCD. Maternal death is very rare with either type of delivery.  The risks of an elective repeat cesarean delivery (ERCD) were reviewed with the patient including but not limited to: 04/998 risk of uterine rupture which could have serious consequences, bleeding which may require transfusion; infection which may require antibiotics; injury to bowel, bladder or other surrounding organs (bowel, bladder, ureters); injury to the fetus; need for additional procedures including hysterectomy in the event of a life-threatening hemorrhage; thromboembolic phenomenon; abnormal placentation; incisional problems; death and other postoperative or anesthesia complications.          Previous Version   History of gestational hypertension 02/18/2019 by Will Bonnet, MD No   Advanced maternal age in multigravida 02/18/2019 by Will Bonnet, MD No    Overview Signed 05/29/2020  9:37 AM by Gae Dry, MD    NIPT nml MFM [place] for anatomy check [ ]           Preterm labor symptoms and general obstetric precautions including but not limited to vaginal bleeding, contractions, leaking of fluid and fetal movement were reviewed in detail with the patient.    Return in about 4 weeks (around 08/21/2020) for rob.  Rod Can, CNM 07/24/2020 8:40 AM

## 2020-07-26 LAB — URINE CULTURE

## 2020-08-07 ENCOUNTER — Other Ambulatory Visit: Payer: Self-pay | Admitting: Obstetrics & Gynecology

## 2020-08-07 DIAGNOSIS — O09522 Supervision of elderly multigravida, second trimester: Secondary | ICD-10-CM

## 2020-08-07 DIAGNOSIS — Z98891 History of uterine scar from previous surgery: Secondary | ICD-10-CM

## 2020-08-17 ENCOUNTER — Other Ambulatory Visit: Payer: Self-pay

## 2020-08-17 ENCOUNTER — Ambulatory Visit: Payer: BC Managed Care – PPO | Attending: Maternal & Fetal Medicine

## 2020-08-17 DIAGNOSIS — Z3A24 24 weeks gestation of pregnancy: Secondary | ICD-10-CM | POA: Insufficient documentation

## 2020-08-17 DIAGNOSIS — O34219 Maternal care for unspecified type scar from previous cesarean delivery: Secondary | ICD-10-CM | POA: Diagnosis not present

## 2020-08-17 DIAGNOSIS — O09522 Supervision of elderly multigravida, second trimester: Secondary | ICD-10-CM | POA: Diagnosis not present

## 2020-08-17 DIAGNOSIS — Z98891 History of uterine scar from previous surgery: Secondary | ICD-10-CM

## 2020-08-21 ENCOUNTER — Other Ambulatory Visit: Payer: Self-pay

## 2020-08-21 ENCOUNTER — Ambulatory Visit (INDEPENDENT_AMBULATORY_CARE_PROVIDER_SITE_OTHER): Payer: BC Managed Care – PPO | Admitting: Obstetrics and Gynecology

## 2020-08-21 VITALS — BP 110/68 | Wt 163.0 lb

## 2020-08-21 DIAGNOSIS — O34219 Maternal care for unspecified type scar from previous cesarean delivery: Secondary | ICD-10-CM

## 2020-08-21 DIAGNOSIS — O0992 Supervision of high risk pregnancy, unspecified, second trimester: Secondary | ICD-10-CM

## 2020-08-21 DIAGNOSIS — O0990 Supervision of high risk pregnancy, unspecified, unspecified trimester: Secondary | ICD-10-CM

## 2020-08-21 DIAGNOSIS — Z8759 Personal history of other complications of pregnancy, childbirth and the puerperium: Secondary | ICD-10-CM

## 2020-08-21 DIAGNOSIS — Z369 Encounter for antenatal screening, unspecified: Secondary | ICD-10-CM

## 2020-08-21 LAB — POCT URINALYSIS DIPSTICK OB
Glucose, UA: NEGATIVE
POC,PROTEIN,UA: NEGATIVE

## 2020-08-21 NOTE — Progress Notes (Signed)
ROB - no concerns. RM 4

## 2020-08-21 NOTE — Progress Notes (Signed)
Routine Prenatal Care Visit  Subjective  Kristine Arroyo is a 45 y.o. [phone] at [redacted]w[redacted]d being seen today for ongoing prenatal care.  She is currently monitored for the following issues for this high-risk pregnancy and has Celiac disease; Lumbago; Supervision of high risk pregnancy, antepartum; History of cesarean delivery affecting pregnancy; History of gestational hypertension; and Advanced maternal age in multigravida on their problem list.  ----------------------------------------------------------------------------------- Patient reports no complaints.   Contractions: Not present. Vag. Bleeding: None.  Movement: Present. Denies leaking of fluid.  ----------------------------------------------------------------------------------- The following portions of the patient's history were reviewed and updated as appropriate: allergies, current medications, past family history, past medical history, past social history, past surgical history and problem list. Problem list updated.   Objective  Blood pressure 110/68, weight 163 lb (73.9 kg), last menstrual period 02/27/2020, unknown if currently breastfeeding. Pregravid weight 158 lb (71.7 kg) Total Weight Gain 5 lb (2.268 kg) Urinalysis:      Fetal Status: Fetal Heart Rate (bpm): 145 Fundal Height: 25 cm Movement: Present     General:  Alert, oriented and cooperative. Patient is in no acute distress.  Skin: Skin is warm and dry. No rash noted.   Cardiovascular: Normal heart rate noted  Respiratory: Normal respiratory effort, no problems with respiration noted  Abdomen: Soft, gravid, appropriate for gestational age. Pain/Pressure: Absent     Pelvic:  Cervical exam deferred        Extremities: Normal range of motion.     ental Status: Normal mood and affect. Normal behavior. Normal judgment and thought content.     Assessment   45 y.o. [id number] at 102w1d by  12/03/2020, by Last Menstrual Period presenting for routine prenatal visit  Plan    pregnancy Problems (from 05/01/20 to present)     Problem Noted Resolved   Supervision of high risk pregnancy, antepartum 02/18/2019 by Will Bonnet, MD No   Overview Addendum 05/29/2020  9:36 AM by Gae Dry, MD    [facility] Prenatal Labs  Dating LMPm confirmed by 7 wk [place] Blood type: O/Positive/-- (03/07 0851)   Genetic Screen    NIPS: nml XY Antibody:Negative (03/07 0851)  Anatomic [place]  Rubella: 4.02 (03/07 0851) Varicella: Immune  GTT Third trimester:  RPR: Non Reactive (03/07 0851)   Rhogam n/a HBsAg: Negative (03/07 0851)   TDaP vaccine                   Flu Shot: HIV: Non Reactive (03/07 0851)   Baby Food  Breast                              GBS:   Contraception  BTL Pap:05/01/20  CBB  no   CS/VBAC  R CS desired   Support Person husb            History of cesarean delivery affecting pregnancy 02/18/2019 by Will Bonnet, MD No   Overview Addendum 05/29/2020  9:37 AM by Gae Dry, MD     - due to fetal intolerance of labor - desires repeat c-section - 05/29/20 OB/GYN  Counseling Note 45 y.o. [id number] at [redacted]w[redacted]d with Estimated Date of Delivery: 12/03/20 was seen today in office to discuss trial of labor after cesarean section (TOLAC) versus elective repeat cesarean delivery (ERCD). The following risks were discussed with the patient.  Risk of uterine rupture at term is 0.78 percent with TOLAC and 0.22 percent with  ERCD. 1 in 10 uterine ruptures will result in neonatal death or neurological injury. The benefits of a trial of labor after cesarean (TOLAC) resulting in a vaginal birth after cesarean (VBAC) include the following: shorter length of hospital stay and postpartum recovery (in most cases); fewer complications, such as postpartum fever, wound or uterine infection, thromboembolism (blood clots in the leg or lung), need for blood transfusion and fewer neonatal breathing problems.  The risks of an attempted VBAC or TOLAC include the following: . Risk  of failed trial of labor after cesarean (TOLAC) without a vaginal birth after cesarean (VBAC) resulting in repeat cesarean delivery (RCD) in about 20 to 29 percent of women who attempt VBAC.  Marland Kitchen Risk of rupture of uterus resulting in an emergency cesarean delivery. The risk of uterine rupture may be related in part to the type of uterine incision made during the first cesarean delivery. A previous transverse uterine incision has the lowest risk of rupture (0.2 to 1.5 percent risk). Vertical or T-shaped uterine incisions have a higher risk of uterine rupture (4 to 9 percent risk)The risk of fetal death is very low with both VBAC and elective repeat cesarean delivery (ERCD), but the likelihood of fetal death is higher with VBAC than with ERCD. Maternal death is very rare with either type of delivery.  The risks of an elective repeat cesarean delivery (ERCD) were reviewed with the patient including but not limited to: 04/998 risk of uterine rupture which could have serious consequences, bleeding which may require transfusion; infection which may require antibiotics; injury to bowel, bladder or other surrounding organs (bowel, bladder, ureters); injury to the fetus; need for additional procedures including hysterectomy in the event of a life-threatening hemorrhage; thromboembolic phenomenon; abnormal placentation; incisional problems; death and other postoperative or anesthesia complications.           History of gestational hypertension 02/18/2019 by Will Bonnet, MD No   Advanced maternal age in multigravida 02/18/2019 by Will Bonnet, MD No   Overview Signed 05/29/2020  9:37 AM by Gae Dry, MD    NIPT nml MFM [place] for anatomy check [ ]             Gestational age appropriate obstetric precautions including but not limited to vaginal bleeding, contractions, leaking of fluid and fetal movement were reviewed in detail with the patient.    Return in about 3 weeks (around 09/11/2020)  for ROB and 28 week labs.  Malachy Mood, MD, [facility], [facility] 08/21/2020, 8:49 AM

## 2020-09-11 ENCOUNTER — Other Ambulatory Visit: Payer: Self-pay

## 2020-09-13 ENCOUNTER — Other Ambulatory Visit: Payer: BC Managed Care – PPO

## 2020-09-13 ENCOUNTER — Ambulatory Visit (INDEPENDENT_AMBULATORY_CARE_PROVIDER_SITE_OTHER): Payer: BC Managed Care – PPO | Admitting: Advanced Practice Midwife

## 2020-09-13 ENCOUNTER — Other Ambulatory Visit: Payer: Self-pay

## 2020-09-13 ENCOUNTER — Encounter: Payer: Self-pay | Admitting: Advanced Practice Midwife

## 2020-09-13 VITALS — BP 120/80 | Wt 168.0 lb

## 2020-09-13 DIAGNOSIS — Z3A28 28 weeks gestation of pregnancy: Secondary | ICD-10-CM

## 2020-09-13 DIAGNOSIS — Z369 Encounter for antenatal screening, unspecified: Secondary | ICD-10-CM | POA: Diagnosis not present

## 2020-09-13 DIAGNOSIS — O0990 Supervision of high risk pregnancy, unspecified, unspecified trimester: Secondary | ICD-10-CM | POA: Diagnosis not present

## 2020-09-13 DIAGNOSIS — O09523 Supervision of elderly multigravida, third trimester: Secondary | ICD-10-CM

## 2020-09-13 DIAGNOSIS — O0992 Supervision of high risk pregnancy, unspecified, second trimester: Secondary | ICD-10-CM

## 2020-09-13 DIAGNOSIS — O0993 Supervision of high risk pregnancy, unspecified, third trimester: Secondary | ICD-10-CM

## 2020-09-13 DIAGNOSIS — Z8759 Personal history of other complications of pregnancy, childbirth and the puerperium: Secondary | ICD-10-CM | POA: Diagnosis not present

## 2020-09-13 NOTE — Progress Notes (Signed)
Routine Prenatal Care Visit  Subjective  Kristine Arroyo is a 45 y.o. [phone] at [redacted]w[redacted]d being seen today for ongoing prenatal care.  She is currently monitored for the following issues for this high-risk pregnancy and has Celiac disease; Lumbago; Supervision of high risk pregnancy, antepartum; History of cesarean delivery affecting pregnancy; History of gestational hypertension; and Advanced maternal age in multigravida on their problem list.  ----------------------------------------------------------------------------------- Patient reports some back and hip pains.  Comfort measures reviewed. Contractions: Not present. Vag. Bleeding: None.  Movement: Present. Leaking Fluid denies.  ----------------------------------------------------------------------------------- The following portions of the patient's history were reviewed and updated as appropriate: allergies, current medications, past family history, past medical history, past social history, past surgical history and problem list. Problem list updated.  Objective  Blood pressure 120/80, weight 168 lb (76.2 kg), last menstrual period 02/27/2020 Pregravid weight 158 lb (71.7 kg) Total Weight Gain 10 lb (4.536 kg) Urinalysis: Urine Protein    Urine Glucose    Fetal Status: Fetal Heart Rate (bpm): 147 Fundal Height: 28 cm Movement: Present     General:  Alert, oriented and cooperative. Patient is in no acute distress.  Skin: Skin is warm and dry. No rash noted.   Cardiovascular: Normal heart rate noted  Respiratory: Normal respiratory effort, no problems with respiration noted  Abdomen: Soft, gravid, appropriate for gestational age. Pain/Pressure: Present     Pelvic:  Cervical exam deferred        Extremities: Normal range of motion.  Edema: None  Mental Status: Normal mood and affect. Normal behavior. Normal judgment and thought content.   Assessment   45 y.o. [id number] at [redacted]w[redacted]d by  12/03/2020, by Last Menstrual Period presenting for  routine prenatal visit  Plan   pregnancy Problems (from 05/01/20 to present)    Problem Noted Resolved   Supervision of high risk pregnancy, antepartum 02/18/2019 by Will Bonnet, MD No   Overview Addendum 05/29/2020  9:36 AM by Gae Dry, MD    Clinic [facility] Prenatal Labs  Dating LMPm confirmed by 7 wk [place] Blood type: O/Positive/-- (03/07 0851)   Genetic Screen    NIPS: nml XY Antibody:Negative (03/07 0851)  Anatomic [place]  Rubella: 4.02 (03/07 0851) Varicella: Immune  GTT Third trimester:  RPR: Non Reactive (03/07 0851)   Rhogam n/a HBsAg: Negative (03/07 0851)   TDaP vaccine                   Flu Shot: HIV: Non Reactive (03/07 0851)   Baby Food  Breast                              GBS:   Contraception  BTL Pap:05/01/20  CBB  no   CS/VBAC  R CS desired   Support Person husb            History of cesarean delivery affecting pregnancy 02/18/2019 by Will Bonnet, MD No   Overview Addendum 05/29/2020  9:37 AM by Gae Dry, MD     - due to fetal intolerance of labor - desires repeat c-section - 05/29/20 OB/GYN  Counseling Note 45 y.o. [name] at [redacted]w[redacted]d with Estimated Date of Delivery: 12/03/20 was seen today in office to discuss trial of labor after cesarean section (TOLAC) versus elective repeat cesarean delivery (ERCD). The following risks were discussed with the patient.  Risk of uterine rupture at term is 0.78 percent with TOLAC and 0.22 percent with ERCD.  1 in 10 uterine ruptures will result in neonatal death or neurological injury. The benefits of a trial of labor after cesarean (TOLAC) resulting in a vaginal birth after cesarean (VBAC) include the following: shorter length of hospital stay and postpartum recovery (in most cases); fewer complications, such as postpartum fever, wound or uterine infection, thromboembolism (blood clots in the leg or lung), need for blood transfusion and fewer neonatal breathing problems.  The risks of an attempted VBAC or TOLAC  include the following: Risk of failed trial of labor after cesarean (TOLAC) without a vaginal birth after cesarean (VBAC) resulting in repeat cesarean delivery (RCD) in about 20 to 46 percent of women who attempt VBAC.  Risk of rupture of uterus resulting in an emergency cesarean delivery. The risk of uterine rupture may be related in part to the type of uterine incision made during the first cesarean delivery. A previous transverse uterine incision has the lowest risk of rupture (0.2 to 1.5 percent risk). Vertical or T-shaped uterine incisions have a higher risk of uterine rupture (4 to 9 percent risk)The risk of fetal death is very low with both VBAC and elective repeat cesarean delivery (ERCD), but the likelihood of fetal death is higher with VBAC than with ERCD. Maternal death is very rare with either type of delivery.  The risks of an elective repeat cesarean delivery (ERCD) were reviewed with the patient including but not limited to: 04/998 risk of uterine rupture which could have serious consequences, bleeding which may require transfusion; infection which may require antibiotics; injury to bowel, bladder or other surrounding organs (bowel, bladder, ureters); injury to the fetus; need for additional procedures including hysterectomy in the event of a life-threatening hemorrhage; thromboembolic phenomenon; abnormal placentation; incisional problems; death and other postoperative or anesthesia complications.           History of gestational hypertension 02/18/2019 by Will Bonnet, MD No   Advanced maternal age in multigravida 02/18/2019 by Will Bonnet, MD No   Overview Signed 05/29/2020  9:37 AM by Gae Dry, MD    NIPT nml MFM [place] for anatomy check [ ]         28 week labs today   Preterm labor symptoms and general obstetric precautions including but not limited to vaginal bleeding, contractions, leaking of fluid and fetal movement were reviewed in detail with the  patient. Please refer to After Visit Summary for other counseling recommendations.   Return in about 2 weeks (around 09/27/2020) for rob.  Rod Can, CNM 09/13/2020 9:15 AM

## 2020-09-13 NOTE — Patient Instructions (Signed)
[email or web address].[email or web address]">  Third Trimester of Pregnancy  The third trimester of pregnancy is from week 28 through week 40. This is months 7 through 9. The third trimester is a time when the unborn baby (fetus) is growing rapidly. At the end of the ninth month, the fetus is about 20inches long and weighs 6-10 pounds. Body changes during your third trimester During the third trimester, your body will continue to go through many changes.The changes vary and generally return to normal after your baby is born. Physical changes Your weight will continue to increase. You can expect to gain 25-35 pounds (11-16 kg) by the end of the pregnancy if you begin pregnancy at a normal weight. If you are underweight, you can expect to gain 28-40 lb (about 13-18 kg), and if you are overweight, you can expect to gain 15-25 lb (about 7-11 kg). You may begin to get stretch marks on your hips, abdomen, and breasts. Your breasts will continue to grow and may hurt. A yellow fluid (colostrum) may leak from your breasts. This is the first milk you are producing for your baby. You may have changes in your hair. These can include thickening of your hair, rapid growth, and changes in texture. Some people also have hair loss during or after pregnancy, or hair that feels dry or thin. Your belly button may stick out. You may notice more swelling in your hands, face, or ankles. Health changes You may have heartburn. You may have constipation. You may develop hemorrhoids. You may develop swollen, bulging veins (varicose veins) in your legs. You may have increased body aches in the pelvis, back, or thighs. This is due to weight gain and increased hormones that are relaxing your joints. You may have increased tingling or numbness in your hands, arms, and legs. The skin on your abdomen may also feel numb. You may feel short of breath because of your expanding uterus. Other  changes You may urinate more often because the fetus is moving lower into your pelvis and pressing on your bladder. You may have more problems sleeping. This may be caused by the size of your abdomen, an increased need to urinate, and an increase in your body's metabolism. You may notice the fetus "dropping," or moving lower in your abdomen (lightening). You may have increased vaginal discharge. You may notice that you have pain around your pelvic bone as your uterus distends. Follow these instructions at home: Medicines Follow your health care provider's instructions regarding medicine use. Specific medicines may be either safe or unsafe to take during pregnancy. Do not take any medicines unless approved by your health care provider. Take a prenatal vitamin that contains at least 600 micrograms (mcg) of folic acid. Eating and drinking Eat a healthy diet that includes fresh fruits and vegetables, whole grains, good sources of protein such as meat, eggs, or tofu, and low-fat dairy products. Avoid raw meat and unpasteurized juice, milk, and cheese. These carry germs that can harm you and your baby. Eat 4 or 5 small meals rather than 3 large meals a day. You may need to take these actions to prevent or treat constipation: Drink enough fluid to keep your urine pale yellow. Eat foods that are high in fiber, such as beans, whole grains, and fresh fruits and vegetables. Limit foods that are high in fat and processed sugars, such as fried or sweet foods. Activity Exercise only as directed by your health care provider. Most people can continue their usual exercise routine during pregnancy. Try  to exercise for 30 minutes at least 5 days a week. Stop exercising if you experience contractions in the uterus. Stop exercising if you develop pain or cramping in the lower abdomen or lower back. Avoid heavy lifting. Do not exercise if it is very hot or humid or if you are at a high altitude. If you choose to,  you may continue to have sex unless your health care provider tells you not to. Relieving pain and discomfort Take frequent breaks and rest with your legs raised (elevated) if you have leg cramps or low back pain. Take warm sitz baths to soothe any pain or discomfort caused by hemorrhoids. Use hemorrhoid cream if your health care provider approves. Wear a supportive bra to prevent discomfort from breast tenderness. If you develop varicose veins: Wear support hose as told by your health care provider. Elevate your feet for 15 minutes, 3-4 times a day. Limit salt in your diet. Safety Talk to your health care provider before traveling far distances. Do not use hot tubs, steam rooms, or saunas. Wear your seat belt at all times when driving or riding in a car. Talk with your health care provider if someone is verbally or physically abusive to you. Preparing for birth To prepare for the arrival of your baby: Take prenatal classes to understand, practice, and ask questions about labor and delivery. Visit the hospital and tour the maternity area. Purchase a rear-facing car seat and make sure you know how to install it in your car. Prepare the baby's room or sleeping area. Make sure to remove all pillows and stuffed animals from the baby's crib to prevent suffocation. General instructions Avoid cat litter boxes and soil used by cats. These carry germs that can cause birth defects in the baby. If you have a cat, ask someone to clean the litter box for you. Do not douche or use tampons. Do not use scented sanitary pads. Do not use any products that contain nicotine or tobacco, such as cigarettes, e-cigarettes, and chewing tobacco. If you need help quitting, ask your health care provider. Do not use any herbal remedies, illegal drugs, or medicines that were not prescribed to you. Chemicals in these products can harm your baby. Do not drink alcohol. You will have more frequent prenatal exams during the  third trimester. During a routine prenatal visit, your health care provider will do a physical exam, perform tests, and discuss your overall health. Keep all follow-up visits. This is important. Where to find more information American Pregnancy Association: americanpregnancy.[place] and Gynecologists: PoolDevices.com.pt Office on [employer]: KeywordPortfolios.com.br Contact a health care provider if you have: A fever. Mild pelvic cramps, pelvic pressure, or nagging pain in your abdominal area or lower back. Vomiting or diarrhea. Bad-smelling vaginal discharge or foul-smelling urine. Pain when you urinate. A headache that does not go away when you take medicine. Visual changes or see spots in front of your eyes. Get help right away if: Your water breaks. You have regular contractions less than 5 minutes apart. You have spotting or bleeding from your vagina. You have severe abdominal pain. You have difficulty breathing. You have chest pain. You have fainting spells. You have not felt your baby move for the time period told by your health care provider. You have new or increased pain, swelling, or redness in an arm or leg. Summary The third trimester of pregnancy is from week 28 through week 40 (months 7 through 9). You may have more problems  sleeping. This can be caused by the size of your abdomen, an increased need to urinate, and an increase in your body's metabolism. You will have more frequent prenatal exams during the third trimester. Keep all follow-up visits. This is important. This information is not intended to replace advice given to you by your health care provider. Make sure you discuss any questions you have with your healthcare provider. Document Revised: 07/28/2019 Document Reviewed: 06/03/2019 Elsevier Patient Education  2022 [employer].

## 2020-09-14 LAB — 28 WEEK RH+PANEL
Basophils Absolute: 0 10*3/uL (ref 0.0–0.2)
Basos: 0 %
EOS (ABSOLUTE): 0.1 10*3/uL (ref 0.0–0.4)
Eos: 1 %
Gestational Diabetes Screen: 85 mg/dL (ref 65–139)
HIV Screen 4th Generation wRfx: NONREACTIVE
Hematocrit: 33.9 % — ABNORMAL LOW (ref 34.0–46.6)
Hemoglobin: 11.4 g/dL (ref 11.1–15.9)
Immature Grans (Abs): 0 10*3/uL (ref 0.0–0.1)
Immature Granulocytes: 0 %
Lymphocytes Absolute: 1.8 10*3/uL (ref 0.7–3.1)
Lymphs: 19 %
MCH: 29.7 pg (ref 26.6–33.0)
MCHC: 33.6 g/dL (ref 31.5–35.7)
MCV: 88 fL (ref 79–97)
Monocytes Absolute: 0.4 10*3/uL (ref 0.1–0.9)
Monocytes: 5 %
Neutrophils Absolute: 6.9 10*3/uL (ref 1.4–7.0)
Neutrophils: 75 %
Platelets: 183 10*3/uL (ref 150–450)
RBC: 3.84 x10E6/uL (ref 3.77–5.28)
RDW: 12.9 % (ref 11.7–15.4)
RPR Ser Ql: NONREACTIVE
WBC: 9.3 10*3/uL (ref 3.4–10.8)

## 2020-09-26 ENCOUNTER — Other Ambulatory Visit: Payer: Self-pay | Admitting: Maternal & Fetal Medicine

## 2020-09-26 DIAGNOSIS — Z8719 Personal history of other diseases of the digestive system: Secondary | ICD-10-CM

## 2020-09-26 DIAGNOSIS — Z8759 Personal history of other complications of pregnancy, childbirth and the puerperium: Secondary | ICD-10-CM

## 2020-09-26 DIAGNOSIS — O34219 Maternal care for unspecified type scar from previous cesarean delivery: Secondary | ICD-10-CM

## 2020-09-26 DIAGNOSIS — O09523 Supervision of elderly multigravida, third trimester: Secondary | ICD-10-CM

## 2020-09-27 ENCOUNTER — Ambulatory Visit (INDEPENDENT_AMBULATORY_CARE_PROVIDER_SITE_OTHER): Payer: BC Managed Care – PPO | Admitting: Obstetrics and Gynecology

## 2020-09-27 ENCOUNTER — Other Ambulatory Visit: Payer: Self-pay

## 2020-09-27 ENCOUNTER — Encounter: Payer: Self-pay | Admitting: Obstetrics and Gynecology

## 2020-09-27 VITALS — BP 122/72 | Ht 66.0 in | Wt 169.4 lb

## 2020-09-27 DIAGNOSIS — Z23 Encounter for immunization: Secondary | ICD-10-CM

## 2020-09-27 DIAGNOSIS — O0990 Supervision of high risk pregnancy, unspecified, unspecified trimester: Secondary | ICD-10-CM

## 2020-09-27 DIAGNOSIS — Z3A30 30 weeks gestation of pregnancy: Secondary | ICD-10-CM

## 2020-09-27 LAB — POCT URINALYSIS DIPSTICK OB
Glucose, UA: NEGATIVE
POC,PROTEIN,UA: NEGATIVE

## 2020-09-27 NOTE — Patient Instructions (Signed)
Cesarean Delivery Cesarean birth, or cesarean delivery, is the surgical delivery of a baby through an incision in the abdomen and the uterus. This may be referred to as a C-section. This procedure may be scheduled ahead of time, or it may be done inan emergency situation. Tell a health care provider about: Any allergies you have. All medicines you are taking, including vitamins, herbs, eye drops, creams, and over-the-counter medicines. Any problems you or family members have had with anesthetic medicines. Any blood disorders you have. Any surgeries you have had. Any medical conditions you have. Whether you or any members of your family have a history of deep vein thrombosis (DVT) or pulmonary embolism (PE). What are the risks? Generally, this is a safe procedure. However, problems may occur, including: Infection. Bleeding. Allergic reactions to medicines. Damage to other structures or organs. Blood clots. Injury to your baby. What happens before the procedure? General instructions Follow instructions from your health care provider about eating or drinking restrictions. If you know that you are going to have a cesarean delivery, do not shave your pubic area. Shaving before the procedure may increase your risk of infection. Plan to have someone take you home from the hospital. Ask your health care provider what steps will be taken to prevent infection. These may include: Removing hair at the surgery site. Washing skin with a germ-killing soap. Taking antibiotic medicine. Depending on the reason for your cesarean delivery, you may have a physical exam or additional testing, such as an ultrasound. You may have your blood or urine tested. Questions for your health care provider Ask your health care provider about: Changing or stopping your regular medicines. This is especially important if you are taking diabetes medicines or blood thinners. Your pain management plan. This is especially  important if you plan to breastfeed your baby. How long you will be in the hospital after the procedure. Any concerns you may have about receiving blood products, if you need them during the procedure. Cord blood banking, if you plan to collect your baby's umbilical cord blood. You may also want to ask your health care provider: Whether you will be able to hold or breastfeed your baby while you are still in the operating room. Whether your baby can stay with you immediately after the procedure and during your recovery. Whether a family member or a person of your choice can go with you into the operating room and stay with you during the procedure, immediately after the procedure, and during your recovery. What happens during the procedure?  An IV will be inserted into one of your veins. Fluid and medicines, such as antibiotics, will be given before the surgery. Fetal monitors will be placed on your abdomen to check your baby's heart rate. You may be given a special warming gown to wear to keep your temperature stable. A catheter may be inserted into your bladder through your urethra. This drains your urine during the procedure. You may be given one or more of the following: A medicine to numb the area (local anesthetic). A medicine to make you fall asleep (general anesthetic). A medicine (regional anesthetic) that is injected into your back or through a small thin tube placed in your back (spinal anesthetic or epidural anesthetic). This numbs everything below the injection site and allows you to stay awake during your procedure. If this makes you feel nauseous, tell your health care provider. Medicines will be available to help reduce any nausea you may feel. An incision will  be made in your abdomen, and then in your uterus. If you are awake during your procedure, you may feel tugging and pulling in your abdomen, but you should not feel pain. If you feel pain, tell your health care provider  immediately. Your baby will be removed from your uterus. You may feel more pressure or pushing while this happens. Immediately after birth, your baby will be dried and kept warm. You may be able to hold and breastfeed your baby. The umbilical cord may be clamped and cut during this time. This usually occurs after waiting a period of 1-2 minutes after delivery. Your placenta will be removed from your uterus. Your incisions will be closed with stitches (sutures). Staples, skin glue, or adhesive strips may also be applied to the incision in your abdomen. Bandages (dressings) may be placed over the incision in your abdomen. The procedure may vary among health care providers and hospitals. What happens after the procedure? Your blood pressure, heart rate, breathing rate, and blood oxygen level will be monitored until you are discharged from the hospital. You may continue to receive fluids and medicines through an IV. You will have some pain. Medicines will be available to help control your pain. To help prevent blood clots: You may be given medicines. You may have to wear compression stockings or devices. You will be encouraged to walk around when you are able. Hospital staff will encourage and support bonding with your baby. Your hospital may have you and your baby to stay in the same room (rooming in) during your hospital stay to encourage successful bonding and breastfeeding. You may be encouraged to cough and breathe deeply often. This helps to prevent lung problems. If you have a catheter draining your urine, it will be removed as soon as possible after your procedure. Summary Cesarean birth, or cesarean delivery, is the surgical delivery of a baby through an incision in the abdomen and the uterus. Follow instructions from your health care provider about eating or drinking restrictions before the procedure. You will have some pain after the procedure. Medicines will be available to help control  your pain. Hospital staff will encourage and support bonding with your baby after the procedure. Your hospital may have you and your baby to stay in the same room (rooming in) during your hospital stay to encourage successful bonding and breastfeeding. This information is not intended to replace advice given to you by your health care provider. Make sure you discuss any questions you have with your healthcare provider. Document Revised: 10/18/2019 Document Reviewed: 08/25/2017 Elsevier Patient Education  [place].

## 2020-09-27 NOTE — Progress Notes (Signed)
Routine Prenatal Care Visit  Subjective  Kristine Arroyo is a 44 y.o. [phone] at 73w3dbeing seen today for ongoing prenatal care.  She is currently monitored for the following issues for this high-risk pregnancy and has Celiac disease; Lumbago; Supervision of high risk pregnancy, antepartum; History of cesarean delivery affecting pregnancy; History of gestational hypertension; and Advanced maternal age in multigravida on their problem list.  ----------------------------------------------------------------------------------- Patient reports no complaints.   Contractions: Not present. Vag. Bleeding: None.  Movement: Present. Denies leaking of fluid.  ----------------------------------------------------------------------------------- The following portions of the patient's history were reviewed and updated as appropriate: allergies, current medications, past family history, past medical history, past social history, past surgical history and problem list. Problem list updated.   Objective  Blood pressure 122/72, height '5\' 6"'$  (1.676 m), weight 169 lb 6.4 oz (76.8 kg), last menstrual period 02/27/2020, unknown if currently breastfeeding. Pregravid weight 158 lb (71.7 kg) Total Weight Gain 11 lb 6.4 oz (5.171 kg) Urinalysis:      Fetal Status: Fetal Heart Rate (bpm): 155 Fundal Height: 27 cm Movement: Present     General:  Alert, oriented and cooperative. Patient is in no acute distress.  Skin: Skin is warm and dry. No rash noted.   Cardiovascular: Normal heart rate noted  Respiratory: Normal respiratory effort, no problems with respiration noted  Abdomen: Soft, gravid, appropriate for gestational age. Pain/Pressure: Present     Pelvic:  Cervical exam deferred        Extremities: Normal range of motion.  Edema: None  Mental Status: Normal mood and affect. Normal behavior. Normal judgment and thought content.     Assessment   45y.o. [id number] 339w3dy  12/03/2020, by Last Menstrual  Period presenting for routine prenatal visit  Plan   pregnancy Problems (from 05/01/20 to present)     Problem Noted Resolved   Supervision of high risk pregnancy, antepartum 02/18/2019 by JaWill BonnetMD No   Overview Addendum 09/27/2020  9:05 AM by ScHomero FellersMDGreenwoodrenatal Labs  Dating LMPm confirmed by 7 wk USKorealood type: O/Positive/-- (03/07 0851)   Genetic Screen    NIPS: nml XY Antibody:Negative (03/07 0851)  Anatomic US[place]plete Rubella: 4.02 (03/07 0851) Varicella: Immune  GTT Third trimester: 84 RPR: Non Reactive (03/07 0851)   Rhogam n/a HBsAg: Negative (03/07 0851)   TDaP vaccine   09/27/2020                Flu Shot: HIV: Non Reactive (03/07 0851)   Baby Food  Breast                              GBS:   Contraception  BTL Pap:05/01/20  CBB  no   CS/VBAC  R CS desired   Support Person husb           History of cesarean delivery affecting pregnancy 02/18/2019 by JaWill BonnetMD No   Overview Addendum 05/29/2020  9:37 AM by HaGae DryMD     - due to fetal intolerance of labor - desires repeat c-section - 05/29/20 OB/GYN  Counseling Note [age].o. [id number] 1321w1dth Estimated Date of Delivery: 12/03/20 was seen today in office to discuss trial of labor after cesarean section (TOLAC) versus elective repeat cesarean delivery (ERCD). The following risks were discussed with the patient.  Risk of uterine rupture at term is  0.78 percent with TOLAC and 0.22 percent with ERCD. 1 in 10 uterine ruptures will result in neonatal death or neurological injury. The benefits of a trial of labor after cesarean (TOLAC) resulting in a vaginal birth after cesarean (VBAC) include the following: shorter length of hospital stay and postpartum recovery (in most cases); fewer complications, such as postpartum fever, wound or uterine infection, thromboembolism (blood clots in the leg or lung), need for blood transfusion and fewer neonatal breathing  problems.  The risks of an attempted VBAC or TOLAC include the following: Risk of failed trial of labor after cesarean (TOLAC) without a vaginal birth after cesarean (VBAC) resulting in repeat cesarean delivery (RCD) in about 20 to 107 percent of women who attempt VBAC.  Risk of rupture of uterus resulting in an emergency cesarean delivery. The risk of uterine rupture may be related in part to the type of uterine incision made during the first cesarean delivery. A previous transverse uterine incision has the lowest risk of rupture (0.2 to 1.5 percent risk). Vertical or T-shaped uterine incisions have a higher risk of uterine rupture (4 to 9 percent risk)The risk of fetal death is very low with both VBAC and elective repeat cesarean delivery (ERCD), but the likelihood of fetal death is higher with VBAC than with ERCD. Maternal death is very rare with either type of delivery.  The risks of an elective repeat cesarean delivery (ERCD) were reviewed with the patient including but not limited to: 04/998 risk of uterine rupture which could have serious consequences, bleeding which may require transfusion; infection which may require antibiotics; injury to bowel, bladder or other surrounding organs (bowel, bladder, ureters); injury to the fetus; need for additional procedures including hysterectomy in the event of a life-threatening hemorrhage; thromboembolic phenomenon; abnormal placentation; incisional problems; death and other postoperative or anesthesia complications.          History of gestational hypertension 02/18/2019 by Will Bonnet, MD No   Advanced maternal age in multigravida 02/18/2019 by Will Bonnet, MD No   Overview Signed 05/29/2020  9:37 AM by Gae Dry, MD    NIPT nml MFM [place] for anatomy check '[ ]'$             Cesarean scheduled for 11/28/2020 with Dr. Kenton Kingfisher, patient preference.  TDAP today.  Gestational age appropriate obstetric precautions including but not  limited to vaginal bleeding, contractions, leaking of fluid and fetal movement were reviewed in detail with the patient.    Return in about 2 weeks (around 10/11/2020) for ROB in person.  Homero Fellers MD [facility], [facility] 09/27/2020, 9:06 AM

## 2020-09-28 ENCOUNTER — Ambulatory Visit: Payer: BC Managed Care – PPO | Attending: Obstetrics and Gynecology

## 2020-09-28 ENCOUNTER — Other Ambulatory Visit: Payer: Self-pay

## 2020-09-28 VITALS — BP 133/82 | HR 89 | Temp 98.2°F | Resp 18 | Ht 66.0 in | Wt 169.5 lb

## 2020-09-28 DIAGNOSIS — O0993 Supervision of high risk pregnancy, unspecified, third trimester: Secondary | ICD-10-CM

## 2020-09-28 DIAGNOSIS — O34219 Maternal care for unspecified type scar from previous cesarean delivery: Secondary | ICD-10-CM | POA: Insufficient documentation

## 2020-09-28 DIAGNOSIS — Z3A30 30 weeks gestation of pregnancy: Secondary | ICD-10-CM | POA: Diagnosis not present

## 2020-09-28 DIAGNOSIS — O09523 Supervision of elderly multigravida, third trimester: Secondary | ICD-10-CM

## 2020-09-28 DIAGNOSIS — O0990 Supervision of high risk pregnancy, unspecified, unspecified trimester: Secondary | ICD-10-CM

## 2020-09-28 DIAGNOSIS — Z8759 Personal history of other complications of pregnancy, childbirth and the puerperium: Secondary | ICD-10-CM

## 2020-09-28 DIAGNOSIS — Z8719 Personal history of other diseases of the digestive system: Secondary | ICD-10-CM | POA: Diagnosis not present

## 2020-10-13 ENCOUNTER — Encounter: Payer: BC Managed Care – PPO | Admitting: Advanced Practice Midwife

## 2020-10-13 ENCOUNTER — Ambulatory Visit (INDEPENDENT_AMBULATORY_CARE_PROVIDER_SITE_OTHER): Payer: BC Managed Care – PPO | Admitting: Obstetrics & Gynecology

## 2020-10-13 ENCOUNTER — Other Ambulatory Visit: Payer: Self-pay

## 2020-10-13 ENCOUNTER — Encounter: Payer: Self-pay | Admitting: Obstetrics & Gynecology

## 2020-10-13 VITALS — BP 138/80 | Wt 174.0 lb

## 2020-10-13 DIAGNOSIS — Z3A32 32 weeks gestation of pregnancy: Secondary | ICD-10-CM

## 2020-10-13 DIAGNOSIS — O0993 Supervision of high risk pregnancy, unspecified, third trimester: Secondary | ICD-10-CM

## 2020-10-13 LAB — POCT URINALYSIS DIPSTICK OB
Glucose, UA: NEGATIVE
POC,PROTEIN,UA: NEGATIVE

## 2020-10-13 NOTE — Progress Notes (Signed)
Subjective  Fetal Movement? yes Contractions? no Leaking Fluid? no Vaginal Bleeding? no  Objective  BP 138/80   Wt 174 lb (78.9 kg)   LMP 02/27/2020   BMI 28.08 kg/m  General: NAD Pumonary: no increased work of breathing Abdomen: gravid, non-tender Extremities: no edema Psychiatric: mood appropriate, affect full  Assessment  45 y.o. [id number] at 92w5dby  12/03/2020, by Last Menstrual Period presenting for routine prenatal visit  Plan   Problem List Items Addressed This Visit      Other   Supervision of high risk pregnancy, antepartum - Primary  Other Visit Diagnoses    32 weeks    PNV, FC[place]ings[place] PTL precautions [place]Monthly APT 36 weeks CS 39 weeks, w BTL  pregnancy Problems (from 05/01/20 to present)    Problem Noted Resolved   Supervision of high risk pregnancy, antepartum 02/18/2019 by JWill Bonnet MD No   Overview Addendum 09/27/2020  9:05 AM by SHomero Fellers MD    Clinic [facility] Prenatal Labs  Dating LMPm confirmed by 7 wk UKoreaBlood type: O/Positive/-- (03/07 0851)   Genetic Screen    NIPS: nml XY Antibody:Negative (03/07 0851)  Anatomic [place]omplete Rubella: 4.02 (03/07 0851) Varicella: Immune  GTT Third trimester: 84 RPR: Non Reactive (03/07 0851)   Rhogam n/a HBsAg: Negative (03/07 0851)   TDaP vaccine   09/27/2020                Flu Shot: HIV: Non Reactive (03/07 0851)   Baby Food  Breast                              GBS:   Contraception  BTL Pap:05/01/20  CBB  no   CS/VBAC  R CS desired   Support Person husb           History of cesarean delivery affecting pregnancy 02/18/2019 by JWill Bonnet MD No   Overview Addendum 05/29/2020  9:37 AM by HGae Dry MD     - due to fetal intolerance of labor - desires repeat c-section - 05/29/20 OB/GYN  Counseling Note 45y.o. [id number] 128w1dith Estimated Date of Delivery: 12/03/20 was seen today in office to discuss trial of labor after cesarean section (TOLAC) versus elective repeat cesarean  delivery (ERCD). The following risks were discussed with the patient.  Risk of uterine rupture at term is 0.78 percent with TOLAC and 0.22 percent with ERCD. 1 in 10 uterine ruptures will result in neonatal death or neurological injury. The benefits of a trial of labor after cesarean (TOLAC) resulting in a vaginal birth after cesarean (VBAC) include the following: shorter length of hospital stay and postpartum recovery (in most cases); fewer complications, such as postpartum fever, wound or uterine infection, thromboembolism (blood clots in the leg or lung), need for blood transfusion and fewer neonatal breathing problems.  The risks of an attempted VBAC or TOLAC include the following: Risk of failed trial of labor after cesarean (TOLAC) without a vaginal birth after cesarean (VBAC) resulting in repeat cesarean delivery (RCD) in about 20 to 4079ercent of women who attempt VBAC.  Risk of rupture of uterus resulting in an emergency cesarean delivery. The risk of uterine rupture may be related in part to the type of uterine incision made during the first cesarean delivery. A previous transverse uterine incision has the lowest risk of rupture (0.2 to 1.5 percent risk). Vertical or T-shaped uterine  incisions have a higher risk of uterine rupture (4 to 9 percent risk)The risk of fetal death is very low with both VBAC and elective repeat cesarean delivery (ERCD), but the likelihood of fetal death is higher with VBAC than with ERCD. Maternal death is very rare with either type of delivery.  The risks of an elective repeat cesarean delivery (ERCD) were reviewed with the patient including but not limited to: 04/998 risk of uterine rupture which could have serious consequences, bleeding which may require transfusion; infection which may require antibiotics; injury to bowel, bladder or other surrounding organs (bowel, bladder, ureters); injury to the fetus; need for additional procedures including hysterectomy in the  event of a life-threatening hemorrhage; thromboembolic phenomenon; abnormal placentation; incisional problems; death and other postoperative or anesthesia complications.          History of gestational hypertension 02/18/2019 by Will Bonnet, MD No   Advanced maternal age in multigravida 02/18/2019 by Will Bonnet, MD No   Overview Signed 05/29/2020  9:37 AM by Gae Dry, MD    NIPT nml          [name], MD, [facility], [facility] 10/13/2020  4:20 PM

## 2020-10-13 NOTE — Patient Instructions (Signed)
Thank you for choosing [facility]. As part of our ongoing efforts to improve patient experience, we would appreciate your feedback. Please fill out the short survey that you will receive by mail or MyChart. Your opinion is important to us! -Dr Faduma Cho  Third Trimester of Pregnancy  The third trimester of pregnancy is from week 28 through week 40. This is months 7 through 9. The third trimester is a time when the unborn baby (fetus) is growing rapidly. At the end of the ninth month, the fetus is about 20inches long and weighs 6-10 pounds. Body changes during your third trimester During the third trimester, your body will continue to go through many changes.The changes vary and generally return to normal after your baby is born. Physical changes Your weight will continue to increase. You can expect to gain 25-35 pounds (11-16 kg) by the end of the pregnancy if you begin pregnancy at a normal weight. If you are underweight, you can expect to gain 28-40 lb (about 13-18 kg), and if you are overweight, you can expect to gain 15-25 lb (about 7-11 kg). You may begin to get stretch marks on your hips, abdomen, and breasts. Your breasts will continue to grow and may hurt. A yellow fluid (colostrum) may leak from your breasts. This is the first milk you are producing for your baby. You may have changes in your hair. These can include thickening of your hair, rapid growth, and changes in texture. Some people also have hair loss during or after pregnancy, or hair that feels dry or thin. Your belly button may stick out. You may notice more swelling in your hands, face, or ankles. Health changes You may have heartburn. You may have constipation. You may develop hemorrhoids. You may develop swollen, bulging veins (varicose veins) in your legs. You may have increased body aches in the pelvis, back, or thighs. This is due to weight gain and increased hormones that are relaxing your joints. You may have  increased tingling or numbness in your hands, arms, and legs. The skin on your abdomen may also feel numb. You may feel short of breath because of your expanding uterus. Other changes You may urinate more often because the fetus is moving lower into your pelvis and pressing on your bladder. You may have more problems sleeping. This may be caused by the size of your abdomen, an increased need to urinate, and an increase in your body's metabolism. You may notice the fetus "dropping," or moving lower in your abdomen (lightening). You may have increased vaginal discharge. You may notice that you have pain around your pelvic bone as your uterus distends. Follow these instructions at home: Medicines Follow your health care provider's instructions regarding medicine use. Specific medicines may be either safe or unsafe to take during pregnancy. Do not take any medicines unless approved by your health care provider. Take a prenatal vitamin that contains at least 600 micrograms (mcg) of folic acid. Eating and drinking Eat a healthy diet that includes fresh fruits and vegetables, whole grains, good sources of protein such as meat, eggs, or tofu, and low-fat dairy products. Avoid raw meat and unpasteurized juice, milk, and cheese. These carry germs that can harm you and your baby. Eat 4 or 5 small meals rather than 3 large meals a day. You may need to take these actions to prevent or treat constipation: Drink enough fluid to keep your urine pale yellow. Eat foods that are high in fiber, such as beans, whole grains,   and fresh fruits and vegetables. Limit foods that are high in fat and processed sugars, such as fried or sweet foods. Activity Exercise only as directed by your health care provider. Most people can continue their usual exercise routine during pregnancy. Try to exercise for 30 minutes at least 5 days a week. Stop exercising if you experience contractions in the uterus. Stop exercising if you  develop pain or cramping in the lower abdomen or lower back. Avoid heavy lifting. Do not exercise if it is very hot or humid or if you are at a high altitude. If you choose to, you may continue to have sex unless your health care provider tells you not to. Relieving pain and discomfort Take frequent breaks and rest with your legs raised (elevated) if you have leg cramps or low back pain. Take warm sitz baths to soothe any pain or discomfort caused by hemorrhoids. Use hemorrhoid cream if your health care provider approves. Wear a supportive bra to prevent discomfort from breast tenderness. If you develop varicose veins: Wear support hose as told by your health care provider. Elevate your feet for 15 minutes, 3-4 times a day. Limit salt in your diet. Safety Talk to your health care provider before traveling far distances. Do not use hot tubs, steam rooms, or saunas. Wear your seat belt at all times when driving or riding in a car. Talk with your health care provider if someone is verbally or physically abusive to you. Preparing for birth To prepare for the arrival of your baby: Take prenatal classes to understand, practice, and ask questions about labor and delivery. Visit the hospital and tour the maternity area. Purchase a rear-facing car seat and make sure you know how to install it in your car. Prepare the baby's room or sleeping area. Make sure to remove all pillows and stuffed animals from the baby's crib to prevent suffocation. General instructions Avoid cat litter boxes and soil used by cats. These carry germs that can cause birth defects in the baby. If you have a cat, ask someone to clean the litter box for you. Do not douche or use tampons. Do not use scented sanitary pads. Do not use any products that contain nicotine or tobacco, such as cigarettes, e-cigarettes, and chewing tobacco. If you need help quitting, ask your health care provider. Do not use any herbal remedies, illegal  drugs, or medicines that were not prescribed to you. Chemicals in these products can harm your baby. Do not drink alcohol. You will have more frequent prenatal exams during the third trimester. During a routine prenatal visit, your health care provider will do a physical exam, perform tests, and discuss your overall health. Keep all follow-up visits. This is important. Where to find more information American Pregnancy Association: americanpregnancy.org American College of Obstetricians and Gynecologists: acog.org/en/Womens%20Health/Pregnancy Office on Women's Health: womenshealth.gov/pregnancy Contact a health care provider if you have: A fever. Mild pelvic cramps, pelvic pressure, or nagging pain in your abdominal area or lower back. Vomiting or diarrhea. Bad-smelling vaginal discharge or foul-smelling urine. Pain when you urinate. A headache that does not go away when you take medicine. Visual changes or see spots in front of your eyes. Get help right away if: Your water breaks. You have regular contractions less than 5 minutes apart. You have spotting or bleeding from your vagina. You have severe abdominal pain. You have difficulty breathing. You have chest pain. You have fainting spells. You have not felt your baby move for the time period   told by your health care provider. You have new or increased pain, swelling, or redness in an arm or leg. Summary The third trimester of pregnancy is from week 28 through week 40 (months 7 through 9). You may have more problems sleeping. This can be caused by the size of your abdomen, an increased need to urinate, and an increase in your body's metabolism. You will have more frequent prenatal exams during the third trimester. Keep all follow-up visits. This is important. This information is not intended to replace advice given to you by your health care provider. Make sure you discuss any questions you have with your healthcare provider. Document  Revised: 07/28/2019 Document Reviewed: 06/03/2019 Elsevier Patient Education  2022 Elsevier Inc.  

## 2020-10-27 ENCOUNTER — Encounter: Payer: Self-pay | Admitting: Obstetrics and Gynecology

## 2020-10-27 ENCOUNTER — Observation Stay
Admission: EM | Admit: 2020-10-27 | Discharge: 2020-10-27 | Disposition: A | Discharge status: Home / Self Care | Payer: BC Managed Care – PPO | Attending: Obstetrics and Gynecology | Admitting: Obstetrics and Gynecology

## 2020-10-27 ENCOUNTER — Ambulatory Visit (INDEPENDENT_AMBULATORY_CARE_PROVIDER_SITE_OTHER): Payer: BC Managed Care – PPO | Admitting: Obstetrics

## 2020-10-27 ENCOUNTER — Other Ambulatory Visit: Payer: Self-pay

## 2020-10-27 ENCOUNTER — Other Ambulatory Visit: Payer: Self-pay | Admitting: Obstetrics and Gynecology

## 2020-10-27 VITALS — BP 142/82 | Wt 174.0 lb

## 2020-10-27 DIAGNOSIS — Z3A34 34 weeks gestation of pregnancy: Secondary | ICD-10-CM | POA: Insufficient documentation

## 2020-10-27 DIAGNOSIS — O133 Gestational [pregnancy-induced] hypertension without significant proteinuria, third trimester: Secondary | ICD-10-CM | POA: Insufficient documentation

## 2020-10-27 DIAGNOSIS — Z8759 Personal history of other complications of pregnancy, childbirth and the puerperium: Secondary | ICD-10-CM

## 2020-10-27 DIAGNOSIS — O09523 Supervision of elderly multigravida, third trimester: Secondary | ICD-10-CM | POA: Diagnosis not present

## 2020-10-27 DIAGNOSIS — O0990 Supervision of high risk pregnancy, unspecified, unspecified trimester: Secondary | ICD-10-CM

## 2020-10-27 DIAGNOSIS — O34219 Maternal care for unspecified type scar from previous cesarean delivery: Secondary | ICD-10-CM

## 2020-10-27 LAB — POCT URINALYSIS DIPSTICK OB
Glucose, UA: NEGATIVE
POC,PROTEIN,UA: NEGATIVE

## 2020-10-27 LAB — COMPREHENSIVE METABOLIC PANEL
ALT: 10 U/L (ref 0–44)
AST: 14 U/L — ABNORMAL LOW (ref 15–41)
Albumin: 3.1 g/dL — ABNORMAL LOW (ref 3.5–5.0)
Alkaline Phosphatase: 81 U/L (ref 38–126)
Anion gap: 6 (ref 5–15)
BUN: 10 mg/dL (ref 6–20)
CO2: 20 mmol/L — ABNORMAL LOW (ref 22–32)
Calcium: 8.8 mg/dL — ABNORMAL LOW (ref 8.9–10.3)
Chloride: 106 mmol/L (ref 98–111)
Creatinine, Ser: 0.49 mg/dL (ref 0.44–1.00)
GFR, Estimated: >60 mL/min (ref >60)
Glucose, Bld: 77 mg/dL (ref 70–99)
Potassium: 3.7 mmol/L (ref 3.5–5.1)
Sodium: 132 mmol/L — ABNORMAL LOW (ref 135–145)
Total Bilirubin: 0.5 mg/dL (ref 0.3–1.2)
Total Protein: 6.3 g/dL — ABNORMAL LOW (ref 6.5–8.1)

## 2020-10-27 LAB — CBC
HCT: 32.1 % — ABNORMAL LOW (ref 36.0–46.0)
Hemoglobin: 11.4 g/dL — ABNORMAL LOW (ref 12.0–15.0)
MCH: 30.3 pg (ref 26.0–34.0)
MCHC: 35.5 g/dL (ref 30.0–36.0)
MCV: 85.4 fL (ref 80.0–100.0)
Platelets: 197 10*3/uL (ref 150–400)
RBC: 3.76 MIL/uL — ABNORMAL LOW (ref 3.87–5.11)
RDW: 13.7 % (ref 11.5–15.5)
WBC: 12.7 10*3/uL — ABNORMAL HIGH (ref 4.0–10.5)
nRBC: 0 % (ref 0.0–0.2)

## 2020-10-27 LAB — PROTEIN / CREATININE RATIO, URINE
Creatinine, Urine: 23 mg/dL
Total Protein, Urine: <6 mg/dL

## 2020-10-27 MED ORDER — NIFEDIPINE ER OSMOTIC RELEASE 30 MG PO TB24: 30.0000 mg | ORAL_TABLET | Freq: Every day | ORAL | 2 refills | Status: DC

## 2020-10-27 NOTE — Progress Notes (Signed)
ROB - elevated bp all day. RM 4

## 2020-10-27 NOTE — OB Triage Note (Addendum)
Pt Kristine Arroyo 44 y.o. sent from [facility] for elevated blood pressures. Pt is a [id number] at 70w5dwith hx of previous c/s in 05/2017 . Pt denies signs and symptons consistent with rupture of membranes or active vaginal bleeding. Pt denies contractions and states positive fetal movement. Pt does report unpainful braxton hicks. Pt denies headaches, vision changes, or RUQ pain, +2 reflexes, no clonus, and no abnormal swelling.External FM and TOCO applied to non-tender abdomen and assessing. Initial FHR 142 . Serial blood pressures will be obtained. Verbal orders for CMP, CBC, and p/c urine ratio. Lab work sent and results pending.

## 2020-10-27 NOTE — Progress Notes (Signed)
Pt discharged home per C.Schuman, MD order.  Pt stable and ambulatory and an After Visit Summary was printed and given to the patient. Discharge education completed with patient/family including follow up instructions, appointments, and medication list. Pt received labor and bleeding precautions. Pt spoke with Dr.Schuman regarding starting blood pressure regimen at home and performing frequent blood pressure checks at home and in the office; pt verbalized agreement to plan of care. Patient able to verbalize understanding, all questions fully answered upon discharge. Patient instructed to return to ED, call 911, or call MD for any changes in condition. Pt discharged home alone via personal vehicle.

## 2020-10-27 NOTE — Discharge Summary (Signed)
Physician Discharge Summary  Patient ID: Kristine Arroyo MRN: [id number] DOB/AGE: [birth year] 45 y.o.  Admit date: 10/27/2020 Discharge date: 10/27/2020  Admission Diagnoses: Elevated blood pressure in pregnancy  Discharge Diagnoses: Elevated blood pressure in pregnancy Active Problems:   Indication for care in labor and delivery, antepartum   Discharged Condition: good  Hospital Course: Patient presented to the hospital today after a visit in office.  She had 2 elevated blood pressures in the office and was sent for preeclampsia evaluation.  In the hospital her blood pressures remained in the normal to moderate range.  Her preeclamptic  labs were negative.  She did not endorse symptoms of headache vision changes or right upper quadrant pain.  She reports that in her last pregnancy she was delivered at 37 weeks and related to gestational hypertension.  For approximately 2 weeks before that delivery her blood pressures were increasingly elevated.  She has a home blood pressure cuff and is able to monitor her blood pressures at home.  She checked her blood pressure today at home twice and noticed blood values in the [id number] systolic.  This is the first time that she has had elevated blood pressures at home per the patient, so she assumed she might be sent to the hospital today.  Will start the patient on Procardia 30 mg XL today.  We will plan to follow-up with the patient next week in the office for blood pressure check.  She will continue to monitor her blood pressures at home.  Encouraged the patient to bring a log of her blood pressures to her next office visit.  Discussed warning signs of preeclampsia.  Encouraged the patient to come back to the hospital if she experiences any of the symptoms or if she has blood pressures with systolic values elevated more than 0000000 or diastolic values elevated more than 110.  She has not been taking baby aspirin this pregnancy.  She reports that she was not encouraged  to do so at the start of her pregnancy.  Consults: None  Significant Diagnostic Studies: labs: See EPIC  Treatments:Will start Procardia  Discharge Exam: Blood pressure 132/78, pulse 81, temperature 98.6 F (37 C), temperature source Oral, resp. rate 18, last menstrual period 02/27/2020, unknown if currently breastfeeding. General appearance: alert and cooperative Neurologic: Alert and oriented X 3, normal strength and tone. Normal symmetric reflexes. Normal coordination and gait  Disposition: Discharge disposition: 01-Home or Self Care        Allergies as of 10/27/2020       Reactions   Gluten Meal Nausea And Vomiting   Compares to food poisoning        Medication List     TAKE these medications    NIFEdipine 30 MG 24 hr tablet Commonly known as: PROCARDIA-XL/NIFEDICAL-XL Take 1 tablet (30 mg total) by mouth daily. Can increase to twice a day as needed for symptomatic contractions   PRENATAL VITAMIN PO Take by mouth.         Signed: Homero Fellers 10/27/2020, 6:44 PM

## 2020-10-27 NOTE — Progress Notes (Signed)
Routine Prenatal Care Visit  Subjective  Kristine Arroyo is a 45 y.o. [phone] at 73w5dbeing seen today for ongoing prenatal care.  She is currently monitored for the following issues for this high-risk pregnancy and has Celiac disease; Lumbago; Supervision of high risk pregnancy, antepartum; History of cesarean delivery affecting pregnancy; History of gestational hypertension; and Advanced maternal age in multigravida on their problem list.  ----------------------------------------------------------------------------------- Patient reports that her blood pressure was elevated at home today. she denies any headache or blurred vision. Her baby is moving well..   Contractions: Not present. Vag. Bleeding: None.  Movement: Present. Leaking Fluid denies.  ----------------------------------------------------------------------------------- The following portions of the patient's history were reviewed and updated as appropriate: allergies, current medications, past family history, past medical history, past social history, past surgical history and problem list. Problem list updated.  Objective  Blood pressure (!) 152/80, weight 174 lb (78.9 kg), last menstrual period 02/27/2020, unknown if currently breastfeeding. Pregravid weight 158 lb (71.7 kg) Total Weight Gain 16 lb (7.258 kg) Urinalysis: Urine Protein Negative  Urine Glucose Negative  Fetal Status: Fetal Heart Rate (bpm): 140s Fundal Height: 35 cm Movement: Present  Presentation: Vertex  General:  Alert, oriented and cooperative. Patient is in no acute distress.  Skin: Skin is warm and dry. No rash noted.   Cardiovascular: Normal heart rate noted  Respiratory: Normal respiratory effort, no problems with respiration noted  Abdomen: Soft, gravid, appropriate for gestational age. Pain/Pressure: Absent     Pelvic:  Cervical exam deferred        Extremities: Normal range of motion.  Edema: None  Mental Status: Normal mood and affect. Normal  behavior. Normal judgment and thought content.   Assessment   45y.o. [id number] 375w5dy  12/03/2020, by Last Menstrual Period presenting for routine prenatal visit  Plan   pregnancy Problems (from 05/01/20 to present)    Problem Noted Resolved   Supervision of high risk pregnancy, antepartum 02/18/2019 by JaWill BonnetMD No   Overview Addendum 09/27/2020  9:05 AM by ScHomero FellersMDAkronrenatal Labs  Dating LMPm confirmed by 7 wk USKorealood type: O/Positive/-- (03/07 0851)   Genetic Screen    NIPS: nml XY Antibody:Negative (03/07 0851)  Anatomic US[place]omplete Rubella: 4.02 (03/07 0851) Varicella: Immune  GTT Third trimester: 84 RPR: Non Reactive (03/07 0851)   Rhogam n/a HBsAg: Negative (03/07 0851)   TDaP vaccine   09/27/2020                Flu Shot: HIV: Non Reactive (03/07 0851)   Baby Food  Breast                              GBS:   Contraception  BTL Pap:05/01/20  CBB  no   CS/VBAC  R CS desired   Support Person husb           History of cesarean delivery affecting pregnancy 02/18/2019 by JaWill BonnetMD No   Overview Addendum 05/29/2020  9:37 AM by HaGae DryMD     - due to fetal intolerance of labor - desires repeat c-section - 05/29/20 OB/GYN  Counseling Note [age].o. [id number] 1349w1dth Estimated Date of Delivery: 12/03/20 was seen today in office to discuss trial of labor after cesarean section (TOLAC) versus elective repeat cesarean delivery (ERCD). The following risks were discussed with the patient.  Risk of uterine rupture at term is 0.78 percent with TOLAC and 0.22 percent with ERCD. 1 in 10 uterine ruptures will result in neonatal death or neurological injury. The benefits of a trial of labor after cesarean (TOLAC) resulting in a vaginal birth after cesarean (VBAC) include the following: shorter length of hospital stay and postpartum recovery (in most cases); fewer complications, such as postpartum fever, wound or uterine  infection, thromboembolism (blood clots in the leg or lung), need for blood transfusion and fewer neonatal breathing problems.  The risks of an attempted VBAC or TOLAC include the following: Risk of failed trial of labor after cesarean (TOLAC) without a vaginal birth after cesarean (VBAC) resulting in repeat cesarean delivery (RCD) in about 20 to 56 percent of women who attempt VBAC.  Risk of rupture of uterus resulting in an emergency cesarean delivery. The risk of uterine rupture may be related in part to the type of uterine incision made during the first cesarean delivery. A previous transverse uterine incision has the lowest risk of rupture (0.2 to 1.5 percent risk). Vertical or T-shaped uterine incisions have a higher risk of uterine rupture (4 to 9 percent risk)The risk of fetal death is very low with both VBAC and elective repeat cesarean delivery (ERCD), but the likelihood of fetal death is higher with VBAC than with ERCD. Maternal death is very rare with either type of delivery.  The risks of an elective repeat cesarean delivery (ERCD) were reviewed with the patient including but not limited to: 04/998 risk of uterine rupture which could have serious consequences, bleeding which may require transfusion; infection which may require antibiotics; injury to bowel, bladder or other surrounding organs (bowel, bladder, ureters); injury to the fetus; need for additional procedures including hysterectomy in the event of a life-threatening hemorrhage; thromboembolic phenomenon; abnormal placentation; incisional problems; death and other postoperative or anesthesia complications.          History of gestational hypertension 02/18/2019 by Will Bonnet, MD No   Advanced maternal age in multigravida 02/18/2019 by Will Bonnet, MD No   Overview Signed 05/29/2020  9:37 AM by Gae Dry, MD    NIPT nml MFM [place] for anatomy check '[ ]'$           Preterm labor symptoms and general obstetric  precautions including but not limited to vaginal bleeding, contractions, leaking of fluid and fetal movement were reviewed in detail with the patient. Please refer to After Visit Summary for other counseling recommendations.  Blood pressure rechecked- 140s /82. pateint sent to labor and Delivery for EFM, serial BPs and PIH labs. [facility] is called to alert the unit of the patient's arrival.  Return in about 2 weeks (around 11/10/2020) for return OB.  Imagene Riches, CNM  10/27/2020 4:47 PM

## 2020-10-30 ENCOUNTER — Telehealth: Payer: Self-pay

## 2020-10-30 NOTE — Telephone Encounter (Signed)
-----   Message from Homero Fellers, MD sent at 10/27/2020  6:58 PM EDT ----- Please make this patient a Ideal appointment with an MD this week for a BP check.  Thank you,  Dr. Gilman Schmidt

## 2020-10-30 NOTE — Telephone Encounter (Signed)
Called and left generic message for patient to call back to confirm scheduled appointment for Friday, 11/03/20 at 2:50

## 2020-10-31 ENCOUNTER — Other Ambulatory Visit: Payer: Self-pay | Admitting: Obstetrics and Gynecology

## 2020-10-31 DIAGNOSIS — O34219 Maternal care for unspecified type scar from previous cesarean delivery: Secondary | ICD-10-CM

## 2020-10-31 DIAGNOSIS — O09523 Supervision of elderly multigravida, third trimester: Secondary | ICD-10-CM

## 2020-11-02 ENCOUNTER — Ambulatory Visit: Payer: BC Managed Care – PPO | Attending: Obstetrics

## 2020-11-02 ENCOUNTER — Telehealth: Payer: Self-pay

## 2020-11-02 ENCOUNTER — Other Ambulatory Visit: Payer: Self-pay | Admitting: Obstetrics

## 2020-11-02 ENCOUNTER — Other Ambulatory Visit: Payer: Self-pay

## 2020-11-02 VITALS — BP 134/85 | HR 95 | Temp 97.7°F | Ht 66.0 in | Wt 177.0 lb

## 2020-11-02 DIAGNOSIS — Z8759 Personal history of other complications of pregnancy, childbirth and the puerperium: Secondary | ICD-10-CM

## 2020-11-02 DIAGNOSIS — O09523 Supervision of elderly multigravida, third trimester: Secondary | ICD-10-CM | POA: Diagnosis not present

## 2020-11-02 DIAGNOSIS — O133 Gestational [pregnancy-induced] hypertension without significant proteinuria, third trimester: Secondary | ICD-10-CM | POA: Insufficient documentation

## 2020-11-02 DIAGNOSIS — Z3A36 36 weeks gestation of pregnancy: Secondary | ICD-10-CM | POA: Diagnosis not present

## 2020-11-02 DIAGNOSIS — O0990 Supervision of high risk pregnancy, unspecified, unspecified trimester: Secondary | ICD-10-CM

## 2020-11-02 DIAGNOSIS — Z3A35 35 weeks gestation of pregnancy: Secondary | ICD-10-CM | POA: Diagnosis not present

## 2020-11-02 DIAGNOSIS — O34219 Maternal care for unspecified type scar from previous cesarean delivery: Secondary | ICD-10-CM | POA: Diagnosis not present

## 2020-11-02 NOTE — Progress Notes (Signed)
Pt needs HROB w NST on 11/07/20; keep appt 11/10/20 as well.  (Twice weekly testing).

## 2020-11-02 NOTE — Telephone Encounter (Signed)
-----   Message from Gae Dry, MD sent at 11/02/2020 10:25 AM EDT ----- Pt needs HROB w NST on 11/07/20; keep appt 11/10/20 as well.  (Twice weekly testing).

## 2020-11-02 NOTE — Telephone Encounter (Signed)
Patient is scheduled for 11/07/20 at 3:10 with CRS

## 2020-11-03 ENCOUNTER — Encounter: Payer: Self-pay | Admitting: Obstetrics and Gynecology

## 2020-11-03 ENCOUNTER — Ambulatory Visit (INDEPENDENT_AMBULATORY_CARE_PROVIDER_SITE_OTHER): Payer: BC Managed Care – PPO | Admitting: Obstetrics and Gynecology

## 2020-11-03 VITALS — BP 130/80 | Wt 175.0 lb

## 2020-11-03 DIAGNOSIS — Z3A35 35 weeks gestation of pregnancy: Secondary | ICD-10-CM | POA: Diagnosis not present

## 2020-11-03 DIAGNOSIS — O0993 Supervision of high risk pregnancy, unspecified, third trimester: Secondary | ICD-10-CM

## 2020-11-03 DIAGNOSIS — O34219 Maternal care for unspecified type scar from previous cesarean delivery: Secondary | ICD-10-CM

## 2020-11-03 DIAGNOSIS — O133 Gestational [pregnancy-induced] hypertension without significant proteinuria, third trimester: Secondary | ICD-10-CM

## 2020-11-03 DIAGNOSIS — Z8759 Personal history of other complications of pregnancy, childbirth and the puerperium: Secondary | ICD-10-CM

## 2020-11-03 DIAGNOSIS — O09523 Supervision of elderly multigravida, third trimester: Secondary | ICD-10-CM

## 2020-11-03 NOTE — Addendum Note (Signed)
Addended by: Prentice Docker D on: 11/03/2020 04:56 PM   Modules accepted: Orders

## 2020-11-03 NOTE — Progress Notes (Addendum)
Routine Prenatal Care Visit  Subjective  Kristine Arroyo is a 45 y.o. [phone] at 41w5dbeing seen today for ongoing prenatal care.  She is currently monitored for the following issues for this high-risk pregnancy and has Celiac disease; Lumbago; Supervision of high risk pregnancy, antepartum; History of cesarean delivery affecting pregnancy; History of gestational hypertension; Advanced maternal age in multigravida; and Indication for care in labor and delivery, antepartum on their problem list.  ----------------------------------------------------------------------------------- Patient reports concern for increased discharge (?LOF).   Contractions: Not present. Vag. Bleeding: None.  Movement: Present. Leaking Fluid  unsure .  ----------------------------------------------------------------------------------- The following portions of the patient's history were reviewed and updated as appropriate: allergies, current medications, past family history, past medical history, past social history, past surgical history and problem list. Problem list updated.  Objective  Blood pressure 130/80, weight 175 lb (79.4 kg), last menstrual period 02/27/2020, unknown if currently breastfeeding. Pregravid weight 158 lb (71.7 kg) Total Weight Gain 17 lb (7.711 kg) Urinalysis: Urine Protein    Urine Glucose    Fetal Status: Fetal Heart Rate (bpm): 140   Movement: Present     General:  Alert, oriented and cooperative. Patient is in no acute distress.  Skin: Skin is warm and dry. No rash noted.   Cardiovascular: Normal heart rate noted  Respiratory: Normal respiratory effort, no problems with respiration noted  Abdomen: Soft, gravid, appropriate for gestational age. Pain/Pressure: Absent     Pelvic:  Cervical exam deferred        Extremities: Normal range of motion.     Mental Status: Normal mood and affect. Normal behavior. Normal judgment and thought content.   ROM: Pooling: neg Nitrazine: neg Ferning:  neg  Female chaperone present for pelvic exam:   NST: Baseline FHR: 140 beats/min Variability: moderate Accelerations: present Decelerations: present Tocometry: not done  Interpretation:  INDICATIONS: gestational hypertension RESULTS:  A NST procedure was performed with FHR monitoring and a normal baseline established, appropriate time of 20-40 minutes of evaluation, and accels >2 seen w 15x15 characteristics.  Results show a REACTIVE NST.    Assessment   45y.o. [id number] 335w5dy  12/03/2020, by Last Menstrual Period presenting for routine prenatal visit  Plan   pregnancy Problems (from 05/01/20 to present)     Problem Noted Resolved   Supervision of high risk pregnancy, antepartum 02/18/2019 by JaWill BonnetMD No   Overview Addendum 09/27/2020  9:05 AM by ScHomero FellersMDUnion Hillrenatal Labs  Dating LMPm confirmed by 7 wk USKorealood type: O/Positive/-- (03/07 0851)   Genetic Screen    NIPS: nml XY Antibody:Negative (03/07 0851)  Anatomic US[place]omplete Rubella: 4.02 (03/07 0851) Varicella: Immune  GTT Third trimester: 84 RPR: Non Reactive (03/07 0851)   Rhogam n/a HBsAg: Negative (03/07 0851)   TDaP vaccine   09/27/2020                Flu Shot: HIV: Non Reactive (03/07 0851)   Baby Food  Breast                              GBS:   Contraception  BTL Pap:05/01/20  CBB  no   CS/VBAC  R CS desired   Support Person husb           History of cesarean delivery affecting pregnancy 02/18/2019 by JaWill BonnetMD No   Overview Addendum 05/29/2020  9:32 AM by Gae Dry, MD     - due to fetal intolerance of labor - desires repeat c-section - 05/29/20 OB/GYN  Counseling Note 45 y.o. [id number] at 62w1dwith Estimated Date of Delivery: 12/03/20 was seen today in office to discuss trial of labor after cesarean section (TOLAC) versus elective repeat cesarean delivery (ERCD). The following risks were discussed with the patient.  Risk of uterine rupture  at term is 0.78 percent with TOLAC and 0.22 percent with ERCD. 1 in 10 uterine ruptures will result in neonatal death or neurological injury. The benefits of a trial of labor after cesarean (TOLAC) resulting in a vaginal birth after cesarean (VBAC) include the following: shorter length of hospital stay and postpartum recovery (in most cases); fewer complications, such as postpartum fever, wound or uterine infection, thromboembolism (blood clots in the leg or lung), need for blood transfusion and fewer neonatal breathing problems.  The risks of an attempted VBAC or TOLAC include the following: Risk of failed trial of labor after cesarean (TOLAC) without a vaginal birth after cesarean (VBAC) resulting in repeat cesarean delivery (RCD) in about 20 to 451percent of women who attempt VBAC.  Risk of rupture of uterus resulting in an emergency cesarean delivery. The risk of uterine rupture may be related in part to the type of uterine incision made during the first cesarean delivery. A previous transverse uterine incision has the lowest risk of rupture (0.2 to 1.5 percent risk). Vertical or T-shaped uterine incisions have a higher risk of uterine rupture (4 to 9 percent risk)The risk of fetal death is very low with both VBAC and elective repeat cesarean delivery (ERCD), but the likelihood of fetal death is higher with VBAC than with ERCD. Maternal death is very rare with either type of delivery.  The risks of an elective repeat cesarean delivery (ERCD) were reviewed with the patient including but not limited to: 04/998 risk of uterine rupture which could have serious consequences, bleeding which may require transfusion; infection which may require antibiotics; injury to bowel, bladder or other surrounding organs (bowel, bladder, ureters); injury to the fetus; need for additional procedures including hysterectomy in the event of a life-threatening hemorrhage; thromboembolic phenomenon; abnormal placentation;  incisional problems; death and other postoperative or anesthesia complications.          History of gestational hypertension 02/18/2019 by JWill Bonnet MD No   Advanced maternal age in multigravida 02/18/2019 by JWill Bonnet MD No   Overview Signed 05/29/2020  9:37 AM by HGae Dry MD    NIPT nml MFM [place] anatomy check '[ ]'$            Preterm labor symptoms and general obstetric precautions including but not limited to vaginal bleeding, contractions, leaking of fluid and fetal movement were reviewed in detail with the patient. Please refer to After Visit Summary for other counseling recommendations.   - no evidence of ROM - moved c-section to 9/15 given diagnosis of gHTN.   NST reactive today - GBS today  Return for Keep previously scheduled appts.   SPrentice Docker MD, FLoura PardonOB/GYN, [facility] 11/03/2020 3:38 PM

## 2020-11-05 LAB — STREP GP B NAA: Strep Gp B NAA: NEGATIVE

## 2020-11-07 ENCOUNTER — Encounter: Payer: Self-pay | Admitting: Obstetrics and Gynecology

## 2020-11-07 ENCOUNTER — Other Ambulatory Visit: Payer: Self-pay

## 2020-11-07 ENCOUNTER — Ambulatory Visit (INDEPENDENT_AMBULATORY_CARE_PROVIDER_SITE_OTHER): Payer: BC Managed Care – PPO | Admitting: Obstetrics and Gynecology

## 2020-11-07 VITALS — BP 120/70 | Wt 179.4 lb

## 2020-11-07 DIAGNOSIS — Z8759 Personal history of other complications of pregnancy, childbirth and the puerperium: Secondary | ICD-10-CM

## 2020-11-07 DIAGNOSIS — O34219 Maternal care for unspecified type scar from previous cesarean delivery: Secondary | ICD-10-CM

## 2020-11-07 DIAGNOSIS — O09523 Supervision of elderly multigravida, third trimester: Secondary | ICD-10-CM

## 2020-11-07 DIAGNOSIS — O0990 Supervision of high risk pregnancy, unspecified, unspecified trimester: Secondary | ICD-10-CM

## 2020-11-07 DIAGNOSIS — O0993 Supervision of high risk pregnancy, unspecified, third trimester: Secondary | ICD-10-CM

## 2020-11-07 DIAGNOSIS — Z3A36 36 weeks gestation of pregnancy: Secondary | ICD-10-CM

## 2020-11-07 NOTE — Patient Instructions (Signed)

## 2020-11-07 NOTE — Progress Notes (Signed)
Routine Prenatal Care Visit  Subjective  Kristine Arroyo is a 45 y.o. [phone] at 71w2dbeing seen today for ongoing prenatal care.  She is currently monitored for the following issues for this high-risk pregnancy and has Celiac disease; Lumbago; Supervision of high risk pregnancy, antepartum; History of cesarean delivery affecting pregnancy; History of gestational hypertension; Advanced maternal age in multigravida; and Indication for care in labor and delivery, antepartum on their problem list.  ----------------------------------------------------------------------------------- Patient reports no complaints.  She denies headaches vision changes or right upper quadrant pain.  She denies any contractions or leaking of fluid. Contractions: Irregular. Vag. Bleeding: None.  Movement: Present. Denies leaking of fluid.  ----------------------------------------------------------------------------------- The following portions of the patient's history were reviewed and updated as appropriate: allergies, current medications, past family history, past medical history, past social history, past surgical history and problem list. Problem list updated.   Objective  Blood pressure 120/70, weight 179 lb 6.4 oz (81.4 kg), last menstrual period 02/27/2020, unknown if currently breastfeeding. Pregravid weight 158 lb (71.7 kg) Total Weight Gain 21 lb 6.4 oz (9.707 kg) Urinalysis:      Fetal Status:     Movement: Present     General:  Alert, oriented and cooperative. Patient is in no acute distress.  Skin: Skin is warm and dry. No rash noted.   Cardiovascular: Normal heart rate noted  Respiratory: Normal respiratory effort, no problems with respiration noted  Abdomen: Soft, gravid, appropriate for gestational age. Pain/Pressure: Present     Pelvic:  Cervical exam deferred        Extremities: Normal range of motion.     Mental Status: Normal mood and affect. Normal behavior. Normal judgment and thought  content.     Assessment   45y.o. [id number] 362w2dy  12/03/2020, by Last Menstrual Period presenting for routine prenatal visit  Plan   pregnancy Problems (from 05/01/20 to present)     Problem Noted Resolved   Supervision of high risk pregnancy, antepartum 02/18/2019 by JaWill BonnetMD No   Overview Addendum 09/27/2020  9:05 AM by ScHomero FellersMDToptonrenatal Labs  Dating LMPm confirmed by 7 wk USKorealood type: O/Positive/-- (03/07 0851)   Genetic Screen    NIPS: nml XY Antibody:Negative (03/07 0851)  Anatomic US[place]omplete Rubella: 4.02 (03/07 0851) Varicella: Immune  GTT Third trimester: 84 RPR: Non Reactive (03/07 0851)   Rhogam n/a HBsAg: Negative (03/07 0851)   TDaP vaccine   09/27/2020                Flu Shot: HIV: Non Reactive (03/07 0851)   Baby Food  Breast                              GBS:   Contraception  BTL Pap:05/01/20  CBB  no   CS/VBAC  R CS desired   Support Person husb           History of cesarean delivery affecting pregnancy 02/18/2019 by JaWill BonnetMD No   Overview Addendum 05/29/2020  9:37 AM by HaGae DryMD     - due to fetal intolerance of labor - desires repeat c-section - 05/29/20 OB/GYN  Counseling Note [age].o. [id number] 132w1dth Estimated Date of Delivery: 12/03/20 was seen today in office to discuss trial of labor after cesarean section (TOLAC) versus elective repeat cesarean delivery (ERCD). The  following risks were discussed with the patient.  Risk of uterine rupture at term is 0.78 percent with TOLAC and 0.22 percent with ERCD. 1 in 10 uterine ruptures will result in neonatal death or neurological injury. The benefits of a trial of labor after cesarean (TOLAC) resulting in a vaginal birth after cesarean (VBAC) include the following: shorter length of hospital stay and postpartum recovery (in most cases); fewer complications, such as postpartum fever, wound or uterine infection, thromboembolism (blood  clots in the leg or lung), need for blood transfusion and fewer neonatal breathing problems.  The risks of an attempted VBAC or TOLAC include the following: Risk of failed trial of labor after cesarean (TOLAC) without a vaginal birth after cesarean (VBAC) resulting in repeat cesarean delivery (RCD) in about 20 to 18 percent of women who attempt VBAC.  Risk of rupture of uterus resulting in an emergency cesarean delivery. The risk of uterine rupture may be related in part to the type of uterine incision made during the first cesarean delivery. A previous transverse uterine incision has the lowest risk of rupture (0.2 to 1.5 percent risk). Vertical or T-shaped uterine incisions have a higher risk of uterine rupture (4 to 9 percent risk)The risk of fetal death is very low with both VBAC and elective repeat cesarean delivery (ERCD), but the likelihood of fetal death is higher with VBAC than with ERCD. Maternal death is very rare with either type of delivery.  The risks of an elective repeat cesarean delivery (ERCD) were reviewed with the patient including but not limited to: 04/998 risk of uterine rupture which could have serious consequences, bleeding which may require transfusion; infection which may require antibiotics; injury to bowel, bladder or other surrounding organs (bowel, bladder, ureters); injury to the fetus; need for additional procedures including hysterectomy in the event of a life-threatening hemorrhage; thromboembolic phenomenon; abnormal placentation; incisional problems; death and other postoperative or anesthesia complications.          History of gestational hypertension 02/18/2019 by Will Bonnet, MD No   Advanced maternal age in multigravida 02/18/2019 by Will Bonnet, MD No   Overview Signed 05/29/2020  9:37 AM by Gae Dry, MD    NIPT nml MFM [place] for anatomy check '[ ]'$           NST: 150 bpm baseline, moderate variability, 15x15 accelerations, no  decelerations. Reactive  Gestational age appropriate obstetric precautions including but not limited to vaginal bleeding, contractions, leaking of fluid and fetal movement were reviewed in detail with the patient.    Return in about 1 week (around 11/14/2020) for [place] in person for 4 visits.  Homero Fellers MD [facility], [facility] 11/07/2020, 3:44 PM

## 2020-11-10 ENCOUNTER — Other Ambulatory Visit: Payer: Self-pay

## 2020-11-10 ENCOUNTER — Encounter: Payer: Self-pay | Admitting: Advanced Practice Midwife

## 2020-11-10 ENCOUNTER — Ambulatory Visit (INDEPENDENT_AMBULATORY_CARE_PROVIDER_SITE_OTHER): Payer: BC Managed Care – PPO | Admitting: Advanced Practice Midwife

## 2020-11-10 VITALS — BP 138/80 | Wt 179.0 lb

## 2020-11-10 DIAGNOSIS — O139 Gestational [pregnancy-induced] hypertension without significant proteinuria, unspecified trimester: Secondary | ICD-10-CM

## 2020-11-10 DIAGNOSIS — O0990 Supervision of high risk pregnancy, unspecified, unspecified trimester: Secondary | ICD-10-CM | POA: Diagnosis not present

## 2020-11-10 DIAGNOSIS — O0940 Supervision of pregnancy with grand multiparity, unspecified trimester: Secondary | ICD-10-CM

## 2020-11-10 DIAGNOSIS — O09523 Supervision of elderly multigravida, third trimester: Secondary | ICD-10-CM

## 2020-11-10 DIAGNOSIS — Z3A36 36 weeks gestation of pregnancy: Secondary | ICD-10-CM

## 2020-11-10 NOTE — Progress Notes (Signed)
Routine Prenatal Care Visit  Subjective  Kristine Arroyo is a 45 y.o. [phone] at 35w5dbeing seen today for ongoing prenatal care.  She is currently monitored for the following issues for this high-risk pregnancy and has Celiac disease; Lumbago; Supervision of high risk pregnancy, antepartum; History of cesarean delivery affecting pregnancy; Gestational hypertension affecting sixth pregnancy; Advanced maternal age in multigravida; and Indication for care in labor and delivery, antepartum on their problem list.  ----------------------------------------------------------------------------------- Patient reports discomforts of third trimester.   Contractions: Not present. Vag. Bleeding: None.  Movement: Present. Leaking Fluid denies.  ----------------------------------------------------------------------------------- The following portions of the patient's history were reviewed and updated as appropriate: allergies, current medications, past family history, past medical history, past social history, past surgical history and problem list. Problem list updated.  Objective  Blood pressure 138/80, weight 179 lb (81.2 kg), last menstrual period 02/27/2020, unknown if currently breastfeeding. Pregravid weight 158 lb (71.7 kg) Total Weight Gain 21 lb (9.526 kg) Urinalysis: Urine Protein    Urine Glucose    Fetal Status: Fetal Heart Rate (bpm): 140   Movement: Present      NST: reactive 20 minute tracing, moderate variability, +accelerations, -decelerations  General:  Alert, oriented and cooperative. Patient is in no acute distress.  Skin: Skin is warm and dry. No rash noted.   Cardiovascular: Normal heart rate noted  Respiratory: Normal respiratory effort, no problems with respiration noted  Abdomen: Soft, gravid, appropriate for gestational age. Pain/Pressure: Present     Pelvic:  Cervical exam deferred        Extremities: Normal range of motion.  Edema: None  Mental Status: Normal mood and affect.  Normal behavior. Normal judgment and thought content.   Assessment   45y.o. [id number] 36w5dy  12/03/2020, by Last Menstrual Period presenting for routine prenatal visit  Plan   pregnancy Problems (from 05/01/20 to present)    Problem Noted Resolved   Supervision of high risk pregnancy, antepartum 02/18/2019 by JaWill BonnetMD No   Overview Addendum 11/07/2020  3:55 PM by ScHomero FellersMDFreeportrenatal Labs  Dating LMPm confirmed by 7 wk USKorealood type: O/Positive/-- (03/07 0851)   Genetic Screen    NIPS: nml XY Antibody:Negative (03/07 0851)  Anatomic US[place]omplete Rubella: 4.02 (03/07 0851)  Varicella: Immune  GTT Third trimester: 84 RPR: Non Reactive (03/07 0851)   Rhogam n/a HBsAg: Negative (03/07 0851)   TDaP vaccine   09/27/2020                Flu Shot: HIV: Non Reactive (03/07 0851)   Baby Food  Breast                              GBS: positive  Contraception  BTL Pap:05/01/20  CBB  no   CS/VBAC  R CS desired   Support Person husb           History of cesarean delivery affecting pregnancy 02/18/2019 by JaWill BonnetMD No   Overview Addendum 05/29/2020  9:37 AM by HaGae DryMD     - due to fetal intolerance of labor - desires repeat c-section - 05/29/20 OB/GYN  Counseling Note [id number].[id number]. [id number] 1311w1dth Estimated Date of Delivery: 12/03/20 was seen today in office to discuss trial of labor after cesarean section (TOLAC) versus elective repeat cesarean delivery (ERCD). The following risks were discussed with  the patient.  Risk of uterine rupture at term is 0.78 percent with TOLAC and 0.22 percent with ERCD. 1 in 10 uterine ruptures will result in neonatal death or neurological injury. The benefits of a trial of labor after cesarean (TOLAC) resulting in a vaginal birth after cesarean (VBAC) include the following: shorter length of hospital stay and postpartum recovery (in most cases); fewer complications, such as postpartum fever, wound  or uterine infection, thromboembolism (blood clots in the leg or lung), need for blood transfusion and fewer neonatal breathing problems.  The risks of an attempted VBAC or TOLAC include the following: Risk of failed trial of labor after cesarean (TOLAC) without a vaginal birth after cesarean (VBAC) resulting in repeat cesarean delivery (RCD) in about 20 to 69 percent of women who attempt VBAC.  Risk of rupture of uterus resulting in an emergency cesarean delivery. The risk of uterine rupture may be related in part to the type of uterine incision made during the first cesarean delivery. A previous transverse uterine incision has the lowest risk of rupture (0.2 to 1.5 percent risk). Vertical or T-shaped uterine incisions have a higher risk of uterine rupture (4 to 9 percent risk)The risk of fetal death is very low with both VBAC and elective repeat cesarean delivery (ERCD), but the likelihood of fetal death is higher with VBAC than with ERCD. Maternal death is very rare with either type of delivery.  The risks of an elective repeat cesarean delivery (ERCD) were reviewed with the patient including but not limited to: 04/998 risk of uterine rupture which could have serious consequences, bleeding which may require transfusion; infection which may require antibiotics; injury to bowel, bladder or other surrounding organs (bowel, bladder, ureters); injury to the fetus; need for additional procedures including hysterectomy in the event of a life-threatening hemorrhage; thromboembolic phenomenon; abnormal placentation; incisional problems; death and other postoperative or anesthesia complications.          Gestational hypertension affecting sixth pregnancy 02/18/2019 by Will Bonnet, MD No   Advanced maternal age in multigravida 02/18/2019 by Will Bonnet, MD No   Overview Signed 05/29/2020  9:37 AM by Gae Dry, MD    NIPT nml MFM [place] for anatomy check '[ ]'$           Preterm labor  symptoms and general obstetric precautions including but not limited to vaginal bleeding, contractions, leaking of fluid and fetal movement were reviewed in detail with the patient.   Return for scheduled appointments.  Rod Can, CNM 11/10/2020 4:02 PM

## 2020-11-14 ENCOUNTER — Encounter: Payer: Self-pay | Admitting: Obstetrics & Gynecology

## 2020-11-14 ENCOUNTER — Encounter: Payer: Self-pay | Admitting: Urgent Care

## 2020-11-14 ENCOUNTER — Other Ambulatory Visit: Payer: Self-pay

## 2020-11-14 ENCOUNTER — Encounter
Admission: RE | Admit: 2020-11-14 | Discharge: 2020-11-14 | Disposition: A | Discharge status: Home / Self Care | Payer: BC Managed Care – PPO | Source: Ambulatory Visit | Attending: Obstetrics & Gynecology | Admitting: Obstetrics & Gynecology

## 2020-11-14 ENCOUNTER — Other Ambulatory Visit: Payer: Self-pay | Admitting: Obstetrics & Gynecology

## 2020-11-14 ENCOUNTER — Other Ambulatory Visit
Admission: RE | Admit: 2020-11-14 | Discharge: 2020-11-14 | Disposition: A | Discharge status: Home / Self Care | Payer: BC Managed Care – PPO | Source: Ambulatory Visit | Attending: Obstetrics & Gynecology | Admitting: Obstetrics & Gynecology

## 2020-11-14 ENCOUNTER — Ambulatory Visit (INDEPENDENT_AMBULATORY_CARE_PROVIDER_SITE_OTHER): Payer: BC Managed Care – PPO | Admitting: Obstetrics & Gynecology

## 2020-11-14 VITALS — BP 128/80 | Ht 66.0 in | Wt 179.0 lb

## 2020-11-14 DIAGNOSIS — O134 Gestational [pregnancy-induced] hypertension without significant proteinuria, complicating childbirth: Secondary | ICD-10-CM | POA: Diagnosis not present

## 2020-11-14 DIAGNOSIS — O09523 Supervision of elderly multigravida, third trimester: Secondary | ICD-10-CM | POA: Diagnosis not present

## 2020-11-14 DIAGNOSIS — Z01812 Encounter for preprocedural laboratory examination: Secondary | ICD-10-CM | POA: Insufficient documentation

## 2020-11-14 DIAGNOSIS — O34211 Maternal care for low transverse scar from previous cesarean delivery: Secondary | ICD-10-CM | POA: Diagnosis not present

## 2020-11-14 DIAGNOSIS — D62 Acute posthemorrhagic anemia: Secondary | ICD-10-CM | POA: Diagnosis not present

## 2020-11-14 DIAGNOSIS — O0993 Supervision of high risk pregnancy, unspecified, third trimester: Secondary | ICD-10-CM

## 2020-11-14 DIAGNOSIS — O0943 Supervision of pregnancy with grand multiparity, third trimester: Secondary | ICD-10-CM | POA: Diagnosis not present

## 2020-11-14 DIAGNOSIS — O139 Gestational [pregnancy-induced] hypertension without significant proteinuria, unspecified trimester: Secondary | ICD-10-CM

## 2020-11-14 DIAGNOSIS — Z20822 Contact with and (suspected) exposure to covid-19: Secondary | ICD-10-CM | POA: Insufficient documentation

## 2020-11-14 DIAGNOSIS — Z87891 Personal history of nicotine dependence: Secondary | ICD-10-CM | POA: Diagnosis not present

## 2020-11-14 DIAGNOSIS — O34219 Maternal care for unspecified type scar from previous cesarean delivery: Secondary | ICD-10-CM | POA: Diagnosis not present

## 2020-11-14 DIAGNOSIS — O9081 Anemia of the puerperium: Secondary | ICD-10-CM | POA: Diagnosis not present

## 2020-11-14 DIAGNOSIS — Z3A37 37 weeks gestation of pregnancy: Secondary | ICD-10-CM | POA: Diagnosis not present

## 2020-11-14 DIAGNOSIS — O0940 Supervision of pregnancy with grand multiparity, unspecified trimester: Secondary | ICD-10-CM

## 2020-11-14 DIAGNOSIS — Z302 Encounter for sterilization: Secondary | ICD-10-CM | POA: Diagnosis not present

## 2020-11-14 DIAGNOSIS — O1002 Pre-existing essential hypertension complicating childbirth: Secondary | ICD-10-CM | POA: Diagnosis not present

## 2020-11-14 LAB — CBC
HCT: 35.2 % — ABNORMAL LOW (ref 36.0–46.0)
Hemoglobin: 11.9 g/dL — ABNORMAL LOW (ref 12.0–15.0)
MCH: 28.5 pg (ref 26.0–34.0)
MCHC: 33.8 g/dL (ref 30.0–36.0)
MCV: 84.4 fL (ref 80.0–100.0)
Platelets: 217 10*3/uL (ref 150–400)
RBC: 4.17 MIL/uL (ref 3.87–5.11)
RDW: 14.6 % (ref 11.5–15.5)
WBC: 11.6 10*3/uL — ABNORMAL HIGH (ref 4.0–10.5)
nRBC: 0 % (ref 0.0–0.2)

## 2020-11-14 LAB — TYPE AND SCREEN
ABO/RH(D): O POS
Antibody Screen: NEGATIVE
Extend sample reason: UNDETERMINED

## 2020-11-14 LAB — SARS CORONAVIRUS 2 (TAT 6-24 HRS): SARS Coronavirus 2: NEGATIVE

## 2020-11-14 NOTE — Progress Notes (Signed)
PRE-OPERATIVE HISTORY AND PHYSICAL EXAM  HPI:  Kristine Arroyo is a 45 y.o. [id number].  Patient's last menstrual period was 02/27/2020.  49w2dEstimated Date of Delivery: 12/03/20  She is being admitted for Elective repeat.  She also has gestational hypertension and AMA as risk factors.  Denies s/sx of preeclampsia.  PMHx: She  has a past medical history of Celiac disease and Gestational hypertension affecting third pregnancy. Also,  has a past surgical history that includes Hernia repair (Right, 2011); Cesarean section (N/A, 05/03/2017); and Refractive surgery (2011)., family history includes Diabetes Mellitus II in her maternal grandfather and maternal grandmother; Pancreatic cancer in her paternal grandmother; Throat cancer in her maternal grandfather.,  reports that she has quit smoking. Her smoking use included cigarettes. She has never used smokeless tobacco. She reports that she does not currently use alcohol after a past usage of about 1.0 standard drink per week. She reports that she does not use drugs. OB History  Gravida Para Term Preterm AB Living  '6 1 1 '$ 0 4 1  SAB IAB Ectopic Multiple Live Births  '2 1 1   1    '$ # Outcome Date GA Lbr Len/2nd Weight Sex Delivery Anes PTL Lv  6 Current           5 SAB 02/19/19 520w6d  SAB     4 SAB 10/12/18 1w43w3d      3 Term 05/03/17 37w23w2dlb 11 oz (2.58 kg) F CS-LTranv   LIV     Complications: Fetal Intolerance, Gestational hypertension  2 Ectopic 02/2016     ECTOPIC     1 IAB 1995     TAB     Patient denies any other pertinent gynecologic issues. See prenatal record for more complete H&P   Current Outpatient Medications:    bisacodyl (DULCOLAX) 5 MG EC tablet, Take 5 mg by mouth daily as needed for moderate constipation., Disp: , Rfl:    hydrocortisone cream 1 %, Apply 1 application topically 2 (two) times daily as needed for itching., Disp: , Rfl:    NIFEdipine (PROCARDIA-XL/NIFEDICAL-XL) 30 MG 24 hr tablet, Take 1 tablet (30 mg total) by  mouth daily. Can increase to twice a day as needed for symptomatic contractions (Patient taking differently: Take 30 mg by mouth at bedtime. Can increase to twice a day as needed for symptomatic contractions), Disp: 30 tablet, Rfl: 2   Prenatal Vit-Fe Fumarate-FA (PRENATAL VITAMIN PO), Take 1 tablet by mouth daily., Disp: , Rfl:  Also, is allergic to gluten meal.  Review of Systems  Constitutional:  Negative for chills, fever and malaise/fatigue.  HENT:  Negative for congestion, sinus pain and sore throat.   Eyes:  Negative for blurred vision and pain.  Respiratory:  Negative for cough and wheezing.   Cardiovascular:  Negative for chest pain and leg swelling.  Gastrointestinal:  Negative for abdominal pain, constipation, diarrhea, heartburn, nausea and vomiting.  Genitourinary:  Negative for dysuria, frequency, hematuria and urgency.  Musculoskeletal:  Negative for back pain, joint pain, myalgias and neck pain.  Skin:  Negative for itching and rash.  Neurological:  Negative for dizziness, tremors and weakness.  Endo/Heme/Allergies:  Does not bruise/bleed easily.  Psychiatric/Behavioral:  Negative for depression. The patient is not nervous/anxious and does not have insomnia.    Objective: BP 128/80   Ht '5\' 6"'$  (1.676 m)   Wt 179 lb (81.2 kg)   LMP 02/27/2020   BMI  28.89 kg/m  Filed Weights   11/14/20 1110  Weight: 179 lb (81.2 kg)   Physical Exam Constitutional:      General: She is not in acute distress.    Appearance: She is well-developed.  HENT:     Head: Normocephalic and atraumatic. No laceration.     Right Ear: Hearing normal.     Left Ear: Hearing normal.     Mouth/Throat:     Pharynx: Uvula midline.  Eyes:     Pupils: Pupils are equal, round, and reactive to light.  Neck:     Thyroid: No thyromegaly.  Cardiovascular:     Rate and Rhythm: Normal rate and regular rhythm.     Heart sounds: No murmur heard.   No friction rub. No gallop.  Pulmonary:     Effort:  Pulmonary effort is normal. No respiratory distress.     Breath sounds: Normal breath sounds. No wheezing.  Abdominal:     General: Bowel sounds are normal. There is no distension.     Palpations: Abdomen is soft.     Tenderness: There is no abdominal tenderness. There is no rebound.     Comments: FHT 160s   Musculoskeletal:        General: Normal range of motion.     Cervical back: Normal range of motion and neck supple.  Neurological:     Mental Status: She is alert and oriented to person, place, and time.     Cranial Nerves: No cranial nerve deficit.  Skin:    General: Skin is warm and dry.  Psychiatric:        Judgment: Judgment normal.  Vitals reviewed.    Assessment: 1. Supervision of high risk pregnancy in third trimester   2. Multigravida of advanced maternal age in third trimester   3. History of cesarean delivery affecting pregnancy   4. Gestational hypertension affecting sixth pregnancy   5. Admission for sterilization     PLAN: 1.  Cesarean Delivery as Scheduled.  Also Tubal for sterility. Monitor BP before and after surgery Consider stopping the Procardia post partum Plans to breast feed  Patient will undergo surgical management with Cesarean Section.   The risks of surgery were discussed in detail with the patient including but not limited to: bleeding which may require transfusion or reoperation; infection which may require antibiotics; injury to surrounding organs which may involve bowel, bladder, ureters ; need for additional procedures including laparoscopy or laparotomy; thromboembolic phenomenon, surgical site problems and other postoperative/anesthesia complications. Likelihood of success in alleviating the patient's condition was discussed. Routine postoperative instructions will be reviewed with the patient and her family in detail after surgery.  The patient concurred with the proposed plan, giving informed written consent for the surgery.  Patient will be  NPO procedure.  Preoperative prophylactic antibiotics, as necessary, and SCDs ordered on call to the OR.  The patient has been fully informed about all methods of contraception, both temporary and permanent. She understands that tubal ligation is meant to be permanent, absolute and irreversible. She was told that there is an approximately 1 in 400 chance of a pregnancy in the future after tubal ligation. She was told the short and long term complications of tubal ligation. She understands the risks from this surgery include, but are not limited to, the risks of anesthesia, hemorrhage, infection, perforation, and injury to adjacent structures, bowel, bladder and blood vessels.   [name], M.D. 11/14/2020 11:28 AM

## 2020-11-14 NOTE — Patient Instructions (Signed)
PRE ADMISSION TESTING For Covid, prior to procedure Wednesday 9:00-10:00 [facility] entrance (drive up)  Results in 48-72 hours You will not receive notification if test results are negative. If positive for Covid19, your provider will notify you by phone, with additional instructions.   Cesarean Delivery, Care After This sheet gives you information about how to care for yourself after your procedure. Your health care provider may also give you more specific instructions. If you have problems or questions, contact your health care provider. What can I expect after the procedure? After the procedure, it is common to have: A small amount of blood or clear fluid coming from the incision. Some redness, swelling, and pain in your incision area. Some abdominal pain and soreness. Vaginal bleeding (lochia). Even though you did not have a vaginal delivery, you will still have vaginal bleeding and discharge. Pelvic cramps. Fatigue. You may have pain, swelling, and discomfort in the tissue between your vagina and your anus (perineum) if: Your C-section was unplanned, and you were allowed to labor and push. An incision was made in the area (episiotomy) or the tissue tore during attempted vaginal delivery. Follow these instructions at home: Incision care  Follow instructions from your health care provider about how to take care of your incision. Make sure you: Wash your hands with soap and water before you change your bandage (dressing). If soap and water are not available, use hand sanitizer. If you have a dressing, change it or remove it as told by your health care provider. Leave stitches (sutures), skin staples, skin glue, or adhesive strips in place. These skin closures may need to stay in place for 2 weeks or longer. If adhesive strip edges start to loosen and curl up, you may trim the loose edges. Do not remove adhesive strips completely unless your health care provider tells you to  do that. Check your incision area every day for signs of infection. Check for: More redness, swelling, or pain. More fluid or blood. Warmth. Pus or a bad smell. Do not take baths, swim, or use a hot tub until your health care provider says it's okay. Ask your health care provider if you can take showers. When you cough or sneeze, hug a pillow. This helps with pain and decreases the chance of your incision opening up (dehiscing). Do this until your incision heals. Medicines Take over-the-counter and prescription medicines only as told by your health care provider. If you were prescribed an antibiotic medicine, take it as told by your health care provider. Do not stop taking the antibiotic even if you start to feel better. Do not drive or use heavy machinery while taking prescription pain medicine. Lifestyle Do not drink alcohol. This is especially important if you are breastfeeding or taking pain medicine. Do not use any products that contain nicotine or tobacco, such as cigarettes, e-cigarettes, and chewing tobacco. If you need help quitting, ask your health care provider. Eating and drinking Drink at least 8 eight-ounce glasses of water every day unless told not to by your health care provider. If you breastfeed, you may need to drink even more water. Eat high-fiber foods every day. These foods may help prevent or relieve constipation. High-fiber foods include: Whole grain cereals and breads. Brown rice. Beans. Fresh fruits and vegetables. Activity  If possible, have someone help you care for your baby and help with household activities for at least a few days after you leave the hospital. Return to your normal activities as told  by your health care provider. Ask your health care provider what activities are safe for you. Rest as much as possible. Try to rest or take a nap while your baby is sleeping. Do not lift anything that is heavier than 10 lbs (4.5 kg), or the limit that you were  told, until your health care provider says that it is safe. Talk with your health care provider about when you can engage in sexual activity. This may depend on your: Risk of infection. How fast you heal. Comfort and desire to engage in sexual activity. General instructions Do not use tampons or douches until your health care provider approves. Wear loose, comfortable clothing and a supportive and well-fitting bra. Keep your perineum clean and dry. Wipe from front to back when you use the toilet. If you pass a blood clot, save it and call your health care provider to discuss. Do not flush blood clots down the toilet before you get instructions from your health care provider. Keep all follow-up visits for you and your baby as told by your health care provider. This is important. Contact a health care provider if: You have: A fever. Bad-smelling vaginal discharge. Pus or a bad smell coming from your incision. Difficulty or pain when urinating. A sudden increase or decrease in the frequency of your bowel movements. More redness, swelling, or pain around your incision. More fluid or blood coming from your incision. A rash. Nausea. Little or no interest in activities you used to enjoy. Questions about caring for yourself or your baby. Your incision feels warm to the touch. Your breasts turn red or become painful or hard. You feel unusually sad or worried. You vomit. You pass a blood clot from your vagina. You urinate more than usual. You are dizzy or light-headed. Get help right away if: You have: Pain that does not go away or get better with medicine. Chest pain. Difficulty breathing. Blurred vision or spots in your vision. Thoughts about hurting yourself or your baby. New pain in your abdomen or in one of your legs. A severe headache. You faint. You bleed from your vagina so much that you fill more than one sanitary pad in one hour. Bleeding should not be heavier than your  heaviest period. Summary After the procedure, it is common to have pain at your incision site, abdominal cramping, and slight bleeding from your vagina. Check your incision area every day for signs of infection. Tell your health care provider about any unusual symptoms. Keep all follow-up visits for you and your baby as told by your health care provider. This information is not intended to replace advice given to you by your health care provider. Make sure you discuss any questions you have with your health care provider.

## 2020-11-14 NOTE — H&P (View-Only) (Signed)
PRE-OPERATIVE HISTORY AND PHYSICAL EXAM  HPI:  Kristine Arroyo is a 45 y.o. [id number].  Patient's last menstrual period was 02/27/2020.  29w2dEstimated Date of Delivery: 12/03/20  She is being admitted for Elective repeat.  She also has gestational hypertension and AMA as risk factors.  Denies s/sx of preeclampsia.  PMHx: She  has a past medical history of Celiac disease and Gestational hypertension affecting third pregnancy. Also,  has a past surgical history that includes Hernia repair (Right, 2011); Cesarean section (N/A, 05/03/2017); and Refractive surgery (2011)., family history includes Diabetes Mellitus II in her maternal grandfather and maternal grandmother; Pancreatic cancer in her paternal grandmother; Throat cancer in her maternal grandfather.,  reports that she has quit smoking. Her smoking use included cigarettes. She has never used smokeless tobacco. She reports that she does not currently use alcohol after a past usage of about 1.0 standard drink per week. She reports that she does not use drugs. OB History  Gravida Para Term Preterm AB Living  '6 1 1 '$ 0 4 1  SAB IAB Ectopic Multiple Live Births  '2 1 1   1    '$ # Outcome Date GA Lbr Len/2nd Weight Sex Delivery Anes PTL Lv  6 Current           5 SAB 02/19/19 553w6d  SAB     4 SAB 10/12/18 1w54w3d      3 Term 05/03/17 37w80w2dlb 11 oz (2.58 kg) F CS-LTranv   LIV     Complications: Fetal Intolerance, Gestational hypertension  2 Ectopic 02/2016     ECTOPIC     1 IAB 1995     TAB     Patient denies any other pertinent gynecologic issues. See prenatal record for more complete H&P   Current Outpatient Medications:    bisacodyl (DULCOLAX) 5 MG EC tablet, Take 5 mg by mouth daily as needed for moderate constipation., Disp: , Rfl:    hydrocortisone cream 1 %, Apply 1 application topically 2 (two) times daily as needed for itching., Disp: , Rfl:    NIFEdipine (PROCARDIA-XL/NIFEDICAL-XL) 30 MG 24 hr tablet, Take 1 tablet (30 mg total) by  mouth daily. Can increase to twice a day as needed for symptomatic contractions (Patient taking differently: Take 30 mg by mouth at bedtime. Can increase to twice a day as needed for symptomatic contractions), Disp: 30 tablet, Rfl: 2   Prenatal Vit-Fe Fumarate-FA (PRENATAL VITAMIN PO), Take 1 tablet by mouth daily., Disp: , Rfl:  Also, is allergic to gluten meal.  Review of Systems  Constitutional:  Negative for chills, fever and malaise/fatigue.  HENT:  Negative for congestion, sinus pain and sore throat.   Eyes:  Negative for blurred vision and pain.  Respiratory:  Negative for cough and wheezing.   Cardiovascular:  Negative for chest pain and leg swelling.  Gastrointestinal:  Negative for abdominal pain, constipation, diarrhea, heartburn, nausea and vomiting.  Genitourinary:  Negative for dysuria, frequency, hematuria and urgency.  Musculoskeletal:  Negative for back pain, joint pain, myalgias and neck pain.  Skin:  Negative for itching and rash.  Neurological:  Negative for dizziness, tremors and weakness.  Endo/Heme/Allergies:  Does not bruise/bleed easily.  Psychiatric/Behavioral:  Negative for depression. The patient is not nervous/anxious and does not have insomnia.    Objective: BP 128/80   Ht '5\' 6"'$  (1.676 m)   Wt 179 lb (81.2 kg)   LMP 02/27/2020   BMI  28.89 kg/m  Filed Weights   11/14/20 1110  Weight: 179 lb (81.2 kg)   Physical Exam Constitutional:      General: She is not in acute distress.    Appearance: She is well-developed.  HENT:     Head: Normocephalic and atraumatic. No laceration.     Right Ear: Hearing normal.     Left Ear: Hearing normal.     Mouth/Throat:     Pharynx: Uvula midline.  Eyes:     Pupils: Pupils are equal, round, and reactive to light.  Neck:     Thyroid: No thyromegaly.  Cardiovascular:     Rate and Rhythm: Normal rate and regular rhythm.     Heart sounds: No murmur heard.   No friction rub. No gallop.  Pulmonary:     Effort:  Pulmonary effort is normal. No respiratory distress.     Breath sounds: Normal breath sounds. No wheezing.  Abdominal:     General: Bowel sounds are normal. There is no distension.     Palpations: Abdomen is soft.     Tenderness: There is no abdominal tenderness. There is no rebound.     Comments: FHT 160s   Musculoskeletal:        General: Normal range of motion.     Cervical back: Normal range of motion and neck supple.  Neurological:     Mental Status: She is alert and oriented to person, place, and time.     Cranial Nerves: No cranial nerve deficit.  Skin:    General: Skin is warm and dry.  Psychiatric:        Judgment: Judgment normal.  Vitals reviewed.    Assessment: 1. Supervision of high risk pregnancy in third trimester   2. Multigravida of advanced maternal age in third trimester   3. History of cesarean delivery affecting pregnancy   4. Gestational hypertension affecting sixth pregnancy   5. Admission for sterilization     PLAN: 1.  Cesarean Delivery as Scheduled.  Also Tubal for sterility. Monitor BP before and after surgery Consider stopping the Procardia post partum Plans to breast feed  Patient will undergo surgical management with Cesarean Section.   The risks of surgery were discussed in detail with the patient including but not limited to: bleeding which may require transfusion or reoperation; infection which may require antibiotics; injury to surrounding organs which may involve bowel, bladder, ureters ; need for additional procedures including laparoscopy or laparotomy; thromboembolic phenomenon, surgical site problems and other postoperative/anesthesia complications. Likelihood of success in alleviating the patient's condition was discussed. Routine postoperative instructions will be reviewed with the patient and her family in detail after surgery.  The patient concurred with the proposed plan, giving informed written consent for the surgery.  Patient will be  NPO procedure.  Preoperative prophylactic antibiotics, as necessary, and SCDs ordered on call to the OR.  The patient has been fully informed about all methods of contraception, both temporary and permanent. She understands that tubal ligation is meant to be permanent, absolute and irreversible. She was told that there is an approximately 1 in 400 chance of a pregnancy in the future after tubal ligation. She was told the short and long term complications of tubal ligation. She understands the risks from this surgery include, but are not limited to, the risks of anesthesia, hemorrhage, infection, perforation, and injury to adjacent structures, bowel, bladder and blood vessels.   [name], M.D. 11/14/2020 11:28 AM

## 2020-11-14 NOTE — Patient Instructions (Addendum)
Your procedure is scheduled on: Thursday, September 15  Arrival Time: Please call Labor and Delivery the day before your scheduled C-Section to find out your arrival time. [phone].  Arrival: If your arrival time is prior to 6:00 am, please enter through the Emergency Room Entrance and you will be directed to Labor and Delivery. If your arrival time is 6:00 am or later, please enter the [facility] and follow the greeter's instructions.  Partner: ONE dedicated partner/guest is allowed to accompany you to Labor and Delivery and into the OR. You may have TWO dedicated visitors during the remainder of your stay on the Mother/Baby Unit. Your guests may leave and return during normal visiting hours, but will have to be re-screened with each entry to the [facility]. These two visitors are not allowed to switch out for a new guest at any point during your hospital stay.   REMEMBER: Instructions that are not followed completely may result in serious medical risk, up to and including death; or upon the discretion of your surgeon and anesthesiologist your surgery may need to be rescheduled.  Do not eat food after midnight the night before surgery.  No gum chewing, lozengers or hard candies.  You may however, drink CLEAR liquids up to 2 hours before you are scheduled to arrive for your surgery. Do not drink anything within 2 hours of your scheduled arrival time.  Clear liquids include: - water  - apple juice without pulp - gatorade (not RED, PURPLE, OR BLUE) - black coffee or tea (Do NOT add milk or creamers to the coffee or tea) Do NOT drink anything that is not on this list.  DO NOT TAKE ANY MEDICATIONS THE MORNING OF SURGERY   One week prior to surgery: Stop Anti-inflammatories (NSAIDS) such as Advil, Aleve, Ibuprofen, Motrin, Naproxen, Naprosyn and Aspirin based products such as Excedrin, Goodys Powder, BC Powder. Stop ANY OVER THE COUNTER supplements until after surgery. You may  however, continue to take Tylenol if needed for pain up until the day of surgery.  No Alcohol for 24 hours before or after surgery.  No Smoking including e-cigarettes for 24 hours prior to surgery.  No chewable tobacco products for at least 6 hours prior to surgery.  No nicotine patches on the day of surgery.  Do not use any "recreational" drugs for at least a week prior to your surgery.  Please be advised that the combination of cocaine and anesthesia may have negative outcomes, up to and including death. If you test positive for cocaine, your surgery will be cancelled.  On the morning of surgery brush your teeth with toothpaste and water, you may rinse your mouth with mouthwash if you wish. Do not swallow any toothpaste or mouthwash.  Do not wear jewelry, make-up, hairpins, clips or nail polish.  Do not wear lotions, powders, or perfumes.   Do not shave body from the neck down 48 hours prior to surgery just in case you cut yourself which could leave a site for infection.   Contact lenses, hearing aids and dentures may not be worn into surgery.  Do not bring valuables to the hospital. [facility] is not responsible for any missing/lost belongings or valuables.   Use CHG wipes as directed on instruction sheet.  Notify your doctor if there is any change in your medical condition (cold, fever, infection).  Wear comfortable clothing (specific to your surgery type) to the hospital.  After surgery, you can help prevent lung complications by doing breathing  exercises.  Take deep breaths and cough every 1-2 hours. Your doctor may order a device called an Incentive Spirometer to help you take deep breaths. When coughing or sneezing, hold a pillow firmly against your incision with both hands. This is called "splinting." Doing this helps protect your incision. It also decreases belly discomfort.  Please call the [facility] Dept. at [phone] if you have any questions about  these instructions.

## 2020-11-15 ENCOUNTER — Other Ambulatory Visit: Payer: BC Managed Care – PPO

## 2020-11-16 ENCOUNTER — Inpatient Hospital Stay: Payer: BC Managed Care – PPO | Admitting: Urgent Care

## 2020-11-16 ENCOUNTER — Other Ambulatory Visit: Payer: Self-pay

## 2020-11-16 ENCOUNTER — Inpatient Hospital Stay: Payer: BC Managed Care – PPO | Admitting: Registered Nurse

## 2020-11-16 ENCOUNTER — Inpatient Hospital Stay
Admission: RE | Admit: 2020-11-16 | Discharge: 2020-11-19 | DRG: 784 | Disposition: A | Discharge status: Home / Self Care | Payer: BC Managed Care – PPO | Attending: Obstetrics & Gynecology | Admitting: Obstetrics & Gynecology

## 2020-11-16 ENCOUNTER — Encounter
Admission: RE | Disposition: A | Discharge status: Home / Self Care | Payer: Self-pay | Source: Home / Self Care | Attending: Obstetrics & Gynecology

## 2020-11-16 ENCOUNTER — Encounter: Payer: Self-pay | Admitting: Obstetrics & Gynecology

## 2020-11-16 DIAGNOSIS — O09523 Supervision of elderly multigravida, third trimester: Secondary | ICD-10-CM

## 2020-11-16 DIAGNOSIS — O34211 Maternal care for low transverse scar from previous cesarean delivery: Principal | ICD-10-CM | POA: Diagnosis present

## 2020-11-16 DIAGNOSIS — Z302 Encounter for sterilization: Secondary | ICD-10-CM

## 2020-11-16 DIAGNOSIS — Z20822 Contact with and (suspected) exposure to covid-19: Secondary | ICD-10-CM | POA: Diagnosis present

## 2020-11-16 DIAGNOSIS — O09529 Supervision of elderly multigravida, unspecified trimester: Secondary | ICD-10-CM

## 2020-11-16 DIAGNOSIS — O34219 Maternal care for unspecified type scar from previous cesarean delivery: Secondary | ICD-10-CM | POA: Diagnosis not present

## 2020-11-16 DIAGNOSIS — Z87891 Personal history of nicotine dependence: Secondary | ICD-10-CM

## 2020-11-16 DIAGNOSIS — Z3A37 37 weeks gestation of pregnancy: Secondary | ICD-10-CM

## 2020-11-16 DIAGNOSIS — O9081 Anemia of the puerperium: Secondary | ICD-10-CM | POA: Diagnosis not present

## 2020-11-16 DIAGNOSIS — O134 Gestational [pregnancy-induced] hypertension without significant proteinuria, complicating childbirth: Secondary | ICD-10-CM

## 2020-11-16 DIAGNOSIS — O1002 Pre-existing essential hypertension complicating childbirth: Secondary | ICD-10-CM | POA: Diagnosis present

## 2020-11-16 DIAGNOSIS — Z98891 History of uterine scar from previous surgery: Secondary | ICD-10-CM

## 2020-11-16 DIAGNOSIS — D62 Acute posthemorrhagic anemia: Secondary | ICD-10-CM | POA: Diagnosis not present

## 2020-11-16 DIAGNOSIS — O0943 Supervision of pregnancy with grand multiparity, third trimester: Secondary | ICD-10-CM | POA: Diagnosis not present

## 2020-11-16 DIAGNOSIS — O0990 Supervision of high risk pregnancy, unspecified, unspecified trimester: Secondary | ICD-10-CM

## 2020-11-16 DIAGNOSIS — O0940 Supervision of pregnancy with grand multiparity, unspecified trimester: Secondary | ICD-10-CM

## 2020-11-16 SURGERY — Surgical Case
Anesthesia: Spinal | Laterality: Bilateral

## 2020-11-16 MED ORDER — ONDANSETRON HCL 4 MG/2ML IJ SOLN
INTRAMUSCULAR | Status: AC
  Filled 2020-11-16: qty 2

## 2020-11-16 MED ORDER — ACETAMINOPHEN 500 MG PO TABS
1000.0000 mg | ORAL_TABLET | Freq: Four times a day (QID) | ORAL | Status: AC
  Administered 2020-11-16 – 2020-11-17 (×4): 1000 mg via ORAL
  Filled 2020-11-16 (×4): qty 2

## 2020-11-16 MED ORDER — FENTANYL CITRATE (PF) 100 MCG/2ML IJ SOLN
INTRAMUSCULAR | Status: AC
  Filled 2020-11-16: qty 2

## 2020-11-16 MED ORDER — KETOROLAC TROMETHAMINE 30 MG/ML IJ SOLN
30.0000 mg | Freq: Four times a day (QID) | INTRAMUSCULAR | Status: DC
  Administered 2020-11-16 – 2020-11-17 (×3): 30 mg via INTRAVENOUS
  Filled 2020-11-16 (×3): qty 1

## 2020-11-16 MED ORDER — ONDANSETRON HCL 4 MG/2ML IJ SOLN
INTRAMUSCULAR | Status: DC | PRN
  Administered 2020-11-16 (×2): 4 mg via INTRAVENOUS

## 2020-11-16 MED ORDER — SODIUM CHLORIDE 0.9% FLUSH: 3.0000 mL | INTRAVENOUS | Status: DC | PRN

## 2020-11-16 MED ORDER — KETOROLAC TROMETHAMINE 30 MG/ML IJ SOLN: 30.0000 mg | Freq: Four times a day (QID) | INTRAMUSCULAR | Status: DC

## 2020-11-16 MED ORDER — OXYTOCIN-SODIUM CHLORIDE 30-0.9 UT/500ML-% IV SOLN
2.5000 [IU]/h | INTRAVENOUS | Status: AC
  Administered 2020-11-16: 2.5 [IU]/h via INTRAVENOUS
  Filled 2020-11-16: qty 500

## 2020-11-16 MED ORDER — ONDANSETRON HCL 4 MG/2ML IJ SOLN: 4.0000 mg | Freq: Three times a day (TID) | INTRAMUSCULAR | Status: DC | PRN

## 2020-11-16 MED ORDER — SOD CITRATE-CITRIC ACID 500-334 MG/5ML PO SOLN
ORAL | Status: AC
  Administered 2020-11-16: 30 mL via ORAL
  Filled 2020-11-16: qty 15

## 2020-11-16 MED ORDER — OXYTOCIN-SODIUM CHLORIDE 30-0.9 UT/500ML-% IV SOLN
INTRAVENOUS | Status: DC | PRN
  Administered 2020-11-16: 300 mL via INTRAVENOUS

## 2020-11-16 MED ORDER — MEPERIDINE HCL 25 MG/ML IJ SOLN: 6.2500 mg | INTRAMUSCULAR | Status: DC | PRN

## 2020-11-16 MED ORDER — DIPHENHYDRAMINE HCL 25 MG PO CAPS: 25.0000 mg | ORAL_CAPSULE | Freq: Four times a day (QID) | ORAL | Status: DC | PRN

## 2020-11-16 MED ORDER — PHENYLEPHRINE HCL (PRESSORS) 10 MG/ML IV SOLN
INTRAVENOUS | Status: DC | PRN
  Administered 2020-11-16: 100 ug via INTRAVENOUS
  Administered 2020-11-16: 150 ug via INTRAVENOUS
  Administered 2020-11-16 (×2): 100 ug via INTRAVENOUS
  Administered 2020-11-16: 150 ug via INTRAVENOUS

## 2020-11-16 MED ORDER — SENNOSIDES-DOCUSATE SODIUM 8.6-50 MG PO TABS
2.0000 | ORAL_TABLET | ORAL | Status: DC
  Administered 2020-11-16 – 2020-11-19 (×3): 2 via ORAL
  Filled 2020-11-16 (×3): qty 2

## 2020-11-16 MED ORDER — ZOLPIDEM TARTRATE 5 MG PO TABS: 5.0000 mg | ORAL_TABLET | Freq: Every evening | ORAL | Status: DC | PRN

## 2020-11-16 MED ORDER — DIBUCAINE (PERIANAL) 1 % EX OINT: 1.0000 "application " | TOPICAL_OINTMENT | CUTANEOUS | Status: DC | PRN

## 2020-11-16 MED ORDER — SODIUM CHLORIDE 0.9 % IV SOLN
INTRAVENOUS | Status: DC | PRN
  Administered 2020-11-16: 50 ug/min via INTRAVENOUS

## 2020-11-16 MED ORDER — DIPHENHYDRAMINE HCL 25 MG PO CAPS: 25.0000 mg | ORAL_CAPSULE | ORAL | Status: DC | PRN

## 2020-11-16 MED ORDER — NALBUPHINE HCL 10 MG/ML IJ SOLN: 5.0000 mg | Freq: Once | INTRAMUSCULAR | Status: DC | PRN

## 2020-11-16 MED ORDER — PRENATAL MULTIVITAMIN CH
1.0000 | ORAL_TABLET | Freq: Every day | ORAL | Status: DC
  Administered 2020-11-17 – 2020-11-19 (×3): 1 via ORAL
  Filled 2020-11-16 (×3): qty 1

## 2020-11-16 MED ORDER — MORPHINE SULFATE (PF) 0.5 MG/ML IJ SOLN
INTRAMUSCULAR | Status: AC
  Filled 2020-11-16: qty 10

## 2020-11-16 MED ORDER — MENTHOL 3 MG MT LOZG
1.0000 | LOZENGE | OROMUCOSAL | Status: DC | PRN
  Filled 2020-11-16: qty 9

## 2020-11-16 MED ORDER — CHLORHEXIDINE GLUCONATE 0.12 % MT SOLN
15.0000 mL | Freq: Once | OROMUCOSAL | Status: AC
Start: 2020-11-16 — End: 2020-11-16
  Administered 2020-11-16: 15 mL via OROMUCOSAL
  Filled 2020-11-16: qty 15

## 2020-11-16 MED ORDER — OXYCODONE HCL 5 MG PO TABS: 5.0000 mg | ORAL_TABLET | ORAL | Status: DC | PRN

## 2020-11-16 MED ORDER — BUPIVACAINE HCL (PF) 0.5 % IJ SOLN
INTRAMUSCULAR | Status: AC
  Filled 2020-11-16: qty 30

## 2020-11-16 MED ORDER — DIPHENHYDRAMINE HCL 50 MG/ML IJ SOLN: 12.5000 mg | INTRAMUSCULAR | Status: DC | PRN

## 2020-11-16 MED ORDER — BUPIVACAINE HCL (PF) 0.5 % IJ SOLN
INTRAMUSCULAR | Status: DC | PRN
  Administered 2020-11-16: 10 mL

## 2020-11-16 MED ORDER — BUPIVACAINE HCL 0.5 % IJ SOLN
10.0000 mL | Freq: Once | INTRAMUSCULAR | Status: DC
  Filled 2020-11-16: qty 10

## 2020-11-16 MED ORDER — POVIDONE-IODINE 10 % EX SWAB: 2.0000 "application " | Freq: Once | CUTANEOUS | Status: DC

## 2020-11-16 MED ORDER — IBUPROFEN 600 MG PO TABS: 600.0000 mg | ORAL_TABLET | Freq: Four times a day (QID) | ORAL | Status: DC

## 2020-11-16 MED ORDER — NALBUPHINE HCL 10 MG/ML IJ SOLN: 5.0000 mg | INTRAMUSCULAR | Status: DC | PRN

## 2020-11-16 MED ORDER — SCOPOLAMINE 1 MG/3DAYS TD PT72: 1.0000 | MEDICATED_PATCH | Freq: Once | TRANSDERMAL | Status: DC

## 2020-11-16 MED ORDER — SIMETHICONE 80 MG PO CHEW
80.0000 mg | CHEWABLE_TABLET | Freq: Three times a day (TID) | ORAL | Status: DC
  Administered 2020-11-16 – 2020-11-19 (×9): 80 mg via ORAL
  Filled 2020-11-16 (×9): qty 1

## 2020-11-16 MED ORDER — MORPHINE SULFATE (PF) 0.5 MG/ML IJ SOLN
INTRAMUSCULAR | Status: DC | PRN
  Administered 2020-11-16: .1 mg via EPIDURAL

## 2020-11-16 MED ORDER — NALOXONE HCL 0.4 MG/ML IJ SOLN: 0.4000 mg | INTRAMUSCULAR | Status: DC | PRN

## 2020-11-16 MED ORDER — SOD CITRATE-CITRIC ACID 500-334 MG/5ML PO SOLN: 30.0000 mL | ORAL | Status: AC

## 2020-11-16 MED ORDER — SIMETHICONE 80 MG PO CHEW
80.0000 mg | CHEWABLE_TABLET | ORAL | Status: DC | PRN
  Filled 2020-11-16: qty 1

## 2020-11-16 MED ORDER — MORPHINE SULFATE (PF) 2 MG/ML IV SOLN: 1.0000 mg | INTRAVENOUS | Status: DC | PRN

## 2020-11-16 MED ORDER — WITCH HAZEL-GLYCERIN EX PADS: 1.0000 "application " | MEDICATED_PAD | CUTANEOUS | Status: DC | PRN

## 2020-11-16 MED ORDER — SODIUM CHLORIDE 0.9 % IV SOLN
2.0000 g | INTRAVENOUS | Status: AC
  Administered 2020-11-16: 2 g via INTRAVENOUS
  Filled 2020-11-16: qty 2

## 2020-11-16 MED ORDER — LACTATED RINGERS IV SOLN: INTRAVENOUS | Status: DC

## 2020-11-16 MED ORDER — OXYTOCIN-SODIUM CHLORIDE 30-0.9 UT/500ML-% IV SOLN
INTRAVENOUS | Status: AC
  Administered 2020-11-16: 2.5 [IU]/h via INTRAVENOUS
  Filled 2020-11-16: qty 1000

## 2020-11-16 MED ORDER — COCONUT OIL OIL
1.0000 "application " | TOPICAL_OIL | Status: DC | PRN
  Filled 2020-11-16: qty 120

## 2020-11-16 MED ORDER — BUPIVACAINE IN DEXTROSE 0.75-8.25 % IT SOLN
INTRATHECAL | Status: DC | PRN
  Administered 2020-11-16: 1.6 mL via INTRATHECAL

## 2020-11-16 MED ORDER — NIFEDIPINE ER OSMOTIC RELEASE 30 MG PO TB24
30.0000 mg | ORAL_TABLET | Freq: Every day | ORAL | Status: DC
  Administered 2020-11-16 – 2020-11-17 (×2): 30 mg via ORAL
  Filled 2020-11-16 (×2): qty 1

## 2020-11-16 MED ORDER — FENTANYL CITRATE (PF) 100 MCG/2ML IJ SOLN
INTRAMUSCULAR | Status: DC | PRN
  Administered 2020-11-16: 10 ug via INTRATHECAL

## 2020-11-16 MED ORDER — BUPIVACAINE 0.25 % ON-Q PUMP DUAL CATH 400 ML
400.0000 mL | INJECTION | Status: DC
  Filled 2020-11-16: qty 400

## 2020-11-16 MED ORDER — ORAL CARE MOUTH RINSE: 15.0000 mL | Freq: Once | OROMUCOSAL | Status: AC

## 2020-11-16 MED ORDER — ACETAMINOPHEN 325 MG PO TABS
650.0000 mg | ORAL_TABLET | ORAL | Status: DC | PRN
  Administered 2020-11-17 – 2020-11-19 (×2): 650 mg via ORAL
  Filled 2020-11-16 (×2): qty 2

## 2020-11-16 MED ORDER — NALBUPHINE HCL 10 MG/ML IJ SOLN
5.0000 mg | INTRAMUSCULAR | Status: DC | PRN
Start: 2020-11-16 — End: 2020-11-20

## 2020-11-16 MED ORDER — NALOXONE HCL 4 MG/10ML IJ SOLN
1.0000 ug/kg/h | INTRAVENOUS | Status: DC | PRN
  Filled 2020-11-16: qty 5

## 2020-11-16 MED ORDER — SODIUM CHLORIDE 0.9 % IV SOLN
INTRAVENOUS | Status: DC | PRN
  Administered 2020-11-16: 100 ug via INTRAVENOUS

## 2020-11-16 SURGICAL SUPPLY — 26 items
CATH KIT ON-Q SILVERSOAK 5IN (CATHETERS) ×4 IMPLANT
CHLORAPREP W/TINT 26 (MISCELLANEOUS) ×4 IMPLANT
DERMABOND ADVANCED (GAUZE/BANDAGES/DRESSINGS) ×1
DERMABOND ADVANCED .7 DNX12 (GAUZE/BANDAGES/DRESSINGS) ×1 IMPLANT
DRSG OPSITE POSTOP 4X10 (GAUZE/BANDAGES/DRESSINGS) ×2 IMPLANT
ELECT CAUTERY BLADE 6.4 (BLADE) ×2 IMPLANT
ELECT REM PT RETURN 9FT ADLT (ELECTROSURGICAL) ×2
ELECTRODE REM PT RTRN 9FT ADLT (ELECTROSURGICAL) ×1 IMPLANT
GLOVE SURG NEOPR MICRO LF SZ8 (GLOVE) ×2 IMPLANT
GOWN STRL REUS W/ TWL LRG LVL3 (GOWN DISPOSABLE) ×1 IMPLANT
GOWN STRL REUS W/ TWL XL LVL3 (GOWN DISPOSABLE) ×2 IMPLANT
GOWN STRL REUS W/TWL LRG LVL3 (GOWN DISPOSABLE) ×1
GOWN STRL REUS W/TWL XL LVL3 (GOWN DISPOSABLE) ×2
MANIFOLD NEPTUNE II (INSTRUMENTS) ×2 IMPLANT
MAT PREVALON FULL STRYKER (MISCELLANEOUS) ×2 IMPLANT
NS IRRIG 1000ML POUR BTL (IV SOLUTION) ×2 IMPLANT
PACK C SECTION AR (MISCELLANEOUS) ×2 IMPLANT
PAD OB MATERNITY 4.3X12.25 (PERSONAL CARE ITEMS) ×2 IMPLANT
PAD PREP 24X41 OB/GYN DISP (PERSONAL CARE ITEMS) ×2 IMPLANT
PENCIL SMOKE EVACUATOR (MISCELLANEOUS) ×2 IMPLANT
SCRUB EXIDINE 4% CHG 4OZ (MISCELLANEOUS) ×2 IMPLANT
SUT MAXON ABS #0 GS21 30IN (SUTURE) ×4 IMPLANT
SUT VIC AB 1 CT1 36 (SUTURE) ×6 IMPLANT
SUT VIC AB 2-0 CT1 36 (SUTURE) ×2 IMPLANT
SUT VIC AB 4-0 FS2 27 (SUTURE) ×2 IMPLANT
WATER STERILE IRR 500ML POUR (IV SOLUTION) ×2 IMPLANT

## 2020-11-16 NOTE — Anesthesia Procedure Notes (Signed)
Spinal  Patient location during procedure: OR Start time: 02/08/2020 12:19 PM End time: 02/11/2020 12:19 PM Reason for block: surgical anesthesia Staffing Performed: resident/CRNA  Resident/CRNA: Hedda Slade, CRNA Other anesthesia staff: Daryel Gerald, RN Preanesthetic Checklist Completed: patient identified, IV checked, site marked, risks and benefits discussed, surgical consent, monitors and equipment checked, pre-op evaluation and timeout performed Spinal Block Patient position: sitting Prep: Betadine Patient monitoring: heart rate, continuous pulse ox, blood pressure and cardiac monitor Approach: midline Location: L4-5 Injection technique: single-shot Needle Needle type: Whitacre and Introducer  Needle gauge: 24 G Needle length: 9 cm Assessment Events: CSF return Additional Notes Negative paresthesia. Negative blood return. Positive free-flowing CSF. Expiration date of kit checked and confirmed. Patient tolerated procedure well, without complications.

## 2020-11-16 NOTE — Anesthesia Preprocedure Evaluation (Addendum)
Anesthesia Evaluation  Patient identified by MRN, date of birth, ID band Patient awake    Reviewed: Allergy & Precautions, NPO status , Patient's Chart, lab work & pertinent test results  Airway Mallampati: III  TM Distance: >3 FB Neck ROM: Full    Dental no notable dental hx.    Pulmonary former smoker,    Pulmonary exam normal        Cardiovascular hypertension, Normal cardiovascular exam     Neuro/Psych negative neurological ROS  negative psych ROS   GI/Hepatic negative GI ROS, Neg liver ROS,   Endo/Other  negative endocrine ROS  Renal/GU negative Renal ROS  negative genitourinary   Musculoskeletal negative musculoskeletal ROS (+)   Abdominal   Peds negative pediatric ROS (+)  Hematology negative hematology ROS (+)   Anesthesia Other Findings   Reproductive/Obstetrics (+) Pregnancy 45 y.o. [id number] with AMA and PIH on nifedipine Presenting for elective repeat C/S                            Anesthesia Physical Anesthesia Plan  ASA: 3  Anesthesia Plan: Spinal   Post-op Pain Management:    Induction:   PONV Risk Score and Plan:   Airway Management Planned: Nasal Cannula  Additional Equipment:   Intra-op Plan:   Post-operative Plan:   Informed Consent: I have reviewed the patients History and Physical, chart, labs and discussed the procedure including the risks, benefits and alternatives for the proposed anesthesia with the patient or authorized representative who has indicated his/her understanding and acceptance.       Plan Discussed with: Anesthesiologist and CRNA  Anesthesia Plan Comments:        Anesthesia Quick Evaluation

## 2020-11-16 NOTE — Op Note (Signed)
Cesarean Section Procedure Note Indications: prior cesarean section and term intrauterine pregnancy, Desire for permanent sterility  Pre-operative Diagnosis: Intrauterine pregnancy [redacted]w[redacted]d;  prior cesarean section and term intrauterine pregnancy, Desire for permanent sterility Post-operative Diagnosis: same, delivered. Procedure: Low Transverse Cesarean Section, Bilateral Tubal Ligation Surgeon: PBarnett Applebaum MD Assistant(s): Dr JGlennon Mac No other capable assistant available, in surgery requiring high level assistant. Anesthesia: Spinal anesthesia Estimated Blood [id number] Complications: None; patient tolerated the procedure well. Disposition: PACU - hemodynamically stable. Condition: stable  Findings: A female infant in the cephalic presentation. "Preston" Amniotic fluid - Clear  Birth weight pending.  Apgars of 8 and 9.  Intact placenta with a three-vessel cord. Grossly normal uterus, tubes and ovaries bilaterally. Minimal intraabdominal adhesions were noted.  Procedure Details   The patient was taken to Operating Room, identified as the correct patient and the procedure verified as C-Section Delivery. A Time Out was held and the above information confirmed. After induction of anesthesia, the patient was draped and prepped in the usual sterile manner. A Pfannenstiel incision was made and carried down through the subcutaneous tissue to the fascia. Fascial incision was made and extended transversely with the Mayo scissors. The fascia was separated from the underlying rectus tissue superiorly and inferiorly. The peritoneum was identified and entered bluntly. Peritoneal incision was extended longitudinally. The utero-vesical peritoneal reflection was incised transversely and a bladder flap was created digitally.  A low transverse hysterotomy was made. The fetus was delivered atraumatically. The umbilical cord was clamped x2 and cut and the infant was handed to the awaiting pediatricians. The  placenta was removed intact and appeared normal with a 3-vessel cord.  The uterus was exteriorized and cleared of all clot and debris. The hysterotomy was closed with running sutures of 0 Vicryl suture. A second imbricating layer was placed with the same suture. Excellent hemostasis was observed.   The left Fallopian tube was identified, grasped with the Babcock clamps, lifted to the skin incision and followed out distally to the fimbriae. An avascular midsection of the tube approximately 3-4cm from the cornua was grasped with the babcock clamps and brought into a knuckle at the skin incision. The tube was double ligated with 2-0 Vicryl suture and the intervening portion of tube was transected and removed. Excellent hemostasis was noted and the tube was returned to the abdomen. Attention was then turned to the right fallopian tube after confirmation of identification by tracing the tube out to the fimbriae. The same procedure was then performed on the right Fallopian tube. Again, excellent hemostasis was noted at the end of the procedure.  The uterus was returned to the abdomen. The pelvis was irrigated and again, excellent hemostasis was noted.  The On Q Pain pump System was then placed.  Trocars were placed through the abdominal wall into the subfascial space and these were used to thread the silver soaker cathaters into place.The rectus fascia was then reapproximated with running sutures of Maxon, with careful placement not to incorporate the cathaters. Subcutaneous tissues are then irrigated with saline and hemostasis assured.  Skin is then closed with 4-0 vicryl suture in a subcuticular fashion followed by skin adhesive.  The surgical assistant performed tissue retraction, assistance with suturing, and fundal pressure.   The cathaters are flushed each with 5 mL of Bupivicaine and stabilized into place with dressing. Instrument, sponge, and needle counts were correct prior to the abdominal closure and at  the conclusion of the case.  The patient tolerated the procedure  well and was transferred to the recovery room in stable condition.   [name], MD, [facility], [facility] 11/16/2020  1:12 PM

## 2020-11-16 NOTE — Interval H&P Note (Signed)
History and Physical Interval Note:  11/16/2020 10:32 AM  Kristine Arroyo  has presented today for surgery, with the diagnosis of History of prior cesarean section, desires sterilization.  The various methods of treatment have been discussed with the patient and family. After consideration of risks, benefits and other options for treatment, the patient has consented to  Procedure(s): Godfrey (Bilateral) as a surgical intervention.  The patient's history has been reviewed, patient examined, no change in status, stable for surgery.  I have reviewed the patient's chart and labs.  Questions were answered to the patient's satisfaction.     [name]

## 2020-11-16 NOTE — Discharge Summary (Signed)
Postpartum Discharge Summary    Patient Name: Kristine Arroyo DOB: October 01, 1975 MRN: [id number]  Date of admission: 11/16/2020 Delivery date:11/16/2020  Delivering provider: Gae Dry  Date of discharge: 11/19/2020  Admitting diagnosis: History of cesarean delivery [Z98.891] Intrauterine pregnancy: [redacted]w[redacted]d    Secondary diagnosis:  Principal Problem:   History of cesarean delivery Active Problems:   Supervision of high risk pregnancy, antepartum   Gestational hypertension affecting sixth pregnancy   Advanced maternal age in multigravida  Additional problems: None    Discharge diagnosis: Term Pregnancy Delivered, CHTN, and Prior CS                                               Post partum procedures: none Augmentation: N/A Complications: None  Hospital course: Sceduled C/S   45y.o. yo [id number] admitted to the hospital 11/16/2020 for scheduled cesarean section with the following indication:Elective Repeat.Delivery details are as follows:  Membrane Rupture Time/Date: 12:27 PM ,11/16/2020   Delivery Method:C-Section, Low Transverse  Details of operation can be found in separate operative note.  Patient had an uncomplicated postpartum course.  She is ambulating, tolerating a regular diet, passing flatus, and urinating well. Patient is discharged home in stable condition on  11/19/20        Newborn Data: Birth date:11/16/2020  Birth time:12:28 PM  Gender:Female  Living status:Living  Apgars:8 ,9  Weight:3260 g     Magnesium Sulfate received: No BMZ received: No Rhophylac:No MMR:No T-DaP:Given prenatally Flu: No Transfusion:No  Physical exam  Vitals:   11/18/20 2306 11/19/20 0034 11/19/20 0341 11/19/20 0942  BP:  132/76 133/82 (!) 139/99  Pulse: 76 72 75 82  Resp: _0 Temp: 98 F (36.7 C)  97.9 F (36.6 C) 98 F (36.7 C)  TempSrc: Oral  Oral Oral  SpO2: 100% 100% 100% 100%  Weight:      Height:       General: alert, cooperative, and no  distress Lochia: appropriate Uterine Fundus: firm Incision: Healing well with no significant drainage DVT Evaluation: No evidence of DVT seen on physical exam. Labs: Lab Results  Component Value Date   WBC 10.9 (H) 11/17/2020   HGB 10.1 (L) 11/17/2020   HCT 29.8 (L) 11/17/2020   MCV 85.9 11/17/2020   PLT 150 11/17/2020   CMP Latest Ref Rng & Units 10/27/2020  Glucose 70 - 99 mg/dL 77  BUN 6 - 20 mg/dL 10  Creatinine 0.44 - 1.00 mg/dL 0.49  Sodium 135 - 145 mmol/L 132(L)  Potassium 3.5 - 5.1 mmol/L 3.7  Chloride 98 - 111 mmol/L 106  CO2 22 - 32 mmol/L 20(L)  Calcium 8.9 - 10.3 mg/dL 8.8(L)  Total Protein 6.5 - 8.1 g/dL 6.3(L)  Total Bilirubin 0.3 - 1.2 mg/dL 0.5  Alkaline Phos 38 - 126 U/L 81  AST 15 - 41 U/L 14(L)  ALT 0 - 44 U/L 10   Edinburgh Score: Edinburgh Postnatal Depression Scale Screening Tool 11/18/2020  I have been able to laugh and see the funny side of things. 0  I have looked forward with enjoyment to things. 0  I have blamed myself unnecessarily when things went wrong. 1  I have been anxious or worried for no good reason. 1  I have felt scared or panicky for no good reason. 0  Things  have been getting on top of me. 1  I have been so unhappy that I have had difficulty sleeping. 0  I have felt sad or miserable. 0  I have been so unhappy that I have been crying. 0  The thought of harming myself has occurred to me. 0  Edinburgh Postnatal Depression Scale Total 3      After visit meds:  Allergies as of 11/19/2020       Reactions   Gluten Meal Nausea And Vomiting   Compares to food poisoning - celiac disease        Medication List     TAKE these medications    bisacodyl 5 MG EC tablet Commonly known as: DULCOLAX Take 5 mg by mouth daily as needed for moderate constipation.   HYDROcodone-acetaminophen 5-325 MG tablet Commonly known as: NORCO/VICODIN Take 1 tablet by mouth every 4 (four) hours as needed for moderate pain.   hydrocortisone cream  1 % Apply 1 application topically 2 (two) times daily as needed for itching.   NIFEdipine 30 MG 24 hr tablet Commonly known as: ADALAT CC Take 1 tablet (30 mg total) by mouth at bedtime. What changed:  when to take this additional instructions   PRENATAL VITAMIN PO Take 1 tablet by mouth daily.               Discharge Care Instructions  (From admission, onward)           Start     Ordered   11/19/20 0000  If the dressing is still on your incision site when you go home, remove it on the third day after your surgery date. Remove dressing if it begins to fall off, or if it is dirty or damaged before the third day.        11/19/20 1032             Discharge home in stable condition Infant Feeding: Breast Infant Disposition:NICU Discharge instruction: per After Visit Summary and Postpartum booklet. Activity: Advance as tolerated. Pelvic rest for 6 weeks.  Diet: routine diet Anticipated Birth Control: BTL done PP Postpartum Appointment:6 weeks Additional Postpartum F/U: Incision check 1 week Future Appointments: Future Appointments  Date Time Provider [place]  11/30/2020 10:40 AM Gae Dry, MD [facility] None  01/11/2021  8:15 AM Ralene Bathe, MD [facility] None   Follow up Visit:  Follow-up Information     Gae Dry, MD. Go in 2 week(s).   Specialty: Obstetrics and Gynecology Why: For Post Op, As Scheduled Contact information: [address] [place] [address] [phone]                     11/19/2020 [name], MD

## 2020-11-16 NOTE — Transfer of Care (Signed)
Immediate Anesthesia Transfer of Care Note  Patient: Kristine Arroyo  Procedure(s) Performed: CESAREAN SECTION WITH BILATERAL TUBAL LIGATION (Bilateral)  Patient Location: PACU  Anesthesia Type:Spinal  Level of Consciousness: awake  Airway & Oxygen Therapy: Patient Spontanous Breathing  Post-op Assessment: Post -op Vital signs reviewed and stable  Post vital signs: Reviewed and stable  Last Vitals:  Vitals Value Taken Time  BP    Temp    Pulse    Resp    SpO2      Last Pain:  Vitals:   11/16/20 1003  TempSrc:   PainSc: 0-No pain         Complications: No notable events documented.

## 2020-11-17 ENCOUNTER — Encounter: Payer: Self-pay | Admitting: Obstetrics & Gynecology

## 2020-11-17 LAB — CBC
HCT: 29.8 % — ABNORMAL LOW (ref 36.0–46.0)
Hemoglobin: 10.1 g/dL — ABNORMAL LOW (ref 12.0–15.0)
MCH: 29.1 pg (ref 26.0–34.0)
MCHC: 33.9 g/dL (ref 30.0–36.0)
MCV: 85.9 fL (ref 80.0–100.0)
Platelets: 150 10*3/uL (ref 150–400)
RBC: 3.47 MIL/uL — ABNORMAL LOW (ref 3.87–5.11)
RDW: 14.7 % (ref 11.5–15.5)
WBC: 10.9 10*3/uL — ABNORMAL HIGH (ref 4.0–10.5)
nRBC: 0 % (ref 0.0–0.2)

## 2020-11-17 MED ORDER — IBUPROFEN 600 MG PO TABS
600.0000 mg | ORAL_TABLET | Freq: Four times a day (QID) | ORAL | Status: DC
  Administered 2020-11-17 – 2020-11-19 (×8): 600 mg via ORAL
  Filled 2020-11-17 (×8): qty 1

## 2020-11-17 NOTE — Progress Notes (Signed)
Obstetric Postpartum/PostOperative Daily Progress Note Subjective:  45 y.o. [id number] post-operative day # 1 status post repeat cesarean delivery.  She is ambulating, is tolerating po, is voiding spontaneously.  Her pain is well controlled on PO pain medications. Her lochia is less than menses. She is skin to skin with newborn in SCN at time of visit. She is pumping to stimulate milk production.   Medications SCHEDULED MEDICATIONS   ketorolac  30 mg Intravenous Q6H   Followed by   ibuprofen  600 mg Oral Q6H   NIFEdipine  30 mg Oral Daily   prenatal multivitamin  1 tablet Oral Q1200   scopolamine  1 patch Transdermal Once   senna-docusate  2 tablet Oral Q24H   simethicone  80 mg Oral TID PC    MEDICATION INFUSIONS   bupivacaine 0.25 % ON-Q pump DUAL CATH 400 mL     lactated ringers Stopped (11/17/20 0436)   naLOXone (NARCAN) adult infusion for PRURITIS      PRN MEDICATIONS  acetaminophen, coconut oil, witch hazel-glycerin **AND** dibucaine, diphenhydrAMINE **OR** diphenhydrAMINE, menthol-cetylpyridinium, morphine injection, nalbuphine **OR** nalbuphine, naloxone **AND** sodium chloride flush, naLOXone (NARCAN) adult infusion for PRURITIS, ondansetron (ZOFRAN) IV, oxyCODONE, oxyCODONE, simethicone, zolpidem    Objective:   Vitals:   11/17/20 0400 11/17/20 0500 11/17/20 0600 11/17/20 0700  BP:    125/77  Pulse: 73 76 80 72  Resp:    16  Temp:    97.9 F (36.6 C)  TempSrc:    Oral  SpO2: 98% 97% 99% 99%  Weight:      Height:        Current Vital Signs 24h Vital Sign Ranges  T 97.9 F (36.6 C) Temp  Avg: 98 F (36.7 C)  Min: 97.4 F (36.3 C)  Max: 98.6 F (37 C)  BP 125/77 BP  Min: 110/96  Max: 163/97  HR 72 Pulse  Avg: 78.4  Min: 66  Max: 100  RR 16 Resp  Avg: 15.8  Min: 4  Max: 32  SaO2 99 % Room Air SpO2  Avg: 98.9 %  Min: 97 %  Max: 100 %       24 Hour I/O Current Shift I/O  Time Ins Outs 09/15 0701 - 09/16 0700 In: 2315.8 [I.V.:2315.8] Out: 2435 [Urine:2065] No  intake/output data recorded.  General: NAD Pulmonary: no increased work of breathing Abdomen: non-distended, non-tender, fundus firm at level of umbilicus Inc: Clean/dry/intact Extremities: no edema, no erythema, no tenderness  Labs:  Recent Labs  Lab 11/14/20 1048 11/17/20 0624  WBC 11.6* 10.9*  HGB 11.9* 10.1*  HCT 35.2* 29.8*  PLT 217 150     Assessment:   45 y.o. [id number] postoperative day # 1 status post repeat cesarean section, lactating  Plan:  1) Acute blood loss anemia - hemodynamically stable and asymptomatic - po ferrous sulfate  2) O POS / Rubella 4.02 (03/07 0851)/ Varicella Immune  3) TDAP status up to date  4) Breastfeeding- currently pumping  5) Contraception = bilateral tubal ligation  6) Disposition: continue current care   Rod Can, CNM 11/17/2020 11:46 AM

## 2020-11-17 NOTE — Lactation Note (Signed)
This note was copied from a baby's chart. Lactation Consultation Note  Patient Name: Kristine Arroyo [id number] Date: 11/17/2020 Reason for consult: Follow-up assessment;NICU baby;Early term 37-38.6wks Age:45 hours  Maternal Data Does the patient have breastfeeding experience prior to this delivery?: Yes How long did the patient breastfeed?: 4 mths  Feeding Mother's Current Feeding Choice: Breast Milk  LATCH Score                 Mom requests to try electric breast pump, had been using manual Harmony pump, pumped in Initiation mode and obtained only few drops of colostrum, mom teary after, reassured and encouraged that the stimulation of pumping would increase her milk supply, colostrum drops were collected on cotton swab and given to [place] in [facility] to administer to baby, encouraged mom to rest for a while, pump again at 1900    Lactation Tools Discussed/Used Tools: Pump Breast pump type: Double-Electric Breast Pump Pump Education: Setup, frequency, and cleaning;Milk Storage Reason for Pumping: baby in SCN Pumping frequency: q 3h Pumped volume:  (drops of colostrum)  Interventions Interventions: DEBP  Discharge Pump: DEBP;Personal  Consult Status Consult Status: PRN    Ferol Luz 11/17/2020, 4:53 PM

## 2020-11-17 NOTE — Lactation Note (Signed)
This note was copied from a baby's chart. Mom states she using a hand pump to pump, desires to use at current time and she is following a breast feeding method from House that recommends this, she is pumping every 2-3 hrs but getting less expressed milk than at first, mom encouraged and informed that this is normal and varies with pumping in the beginning,  encouraged mom to let [facility] know if she desires to use the Symphony electric pump.  [facility] name and no written on white board.

## 2020-11-17 NOTE — Anesthesia Postprocedure Evaluation (Signed)
Anesthesia Post Note  Patient: Kristine Arroyo  Procedure(s) Performed: CESAREAN SECTION WITH BILATERAL TUBAL LIGATION (Bilateral)  Patient location during evaluation: Mother Baby Anesthesia Type: Spinal Level of consciousness: awake and alert and oriented Pain management: pain level controlled Vital Signs Assessment: post-procedure vital signs reviewed and stable Respiratory status: respiratory function stable Cardiovascular status: stable Postop Assessment: no headache, no backache, patient able to bend at knees, no apparent nausea or vomiting, adequate PO intake and able to ambulate Anesthetic complications: no   No notable events documented.   Last Vitals:  Vitals:   11/17/20 0500 11/17/20 0600  BP:    Pulse: 76 80  Resp:    Temp:    SpO2: 97% 99%    Last Pain:  Vitals:   11/17/20 0614  TempSrc:   PainSc: 1                  Lanora Manis

## 2020-11-18 MED ORDER — NIFEDIPINE ER OSMOTIC RELEASE 30 MG PO TB24
30.0000 mg | ORAL_TABLET | Freq: Every day | ORAL | Status: DC
  Administered 2020-11-18 – 2020-11-19 (×2): 30 mg via ORAL
  Filled 2020-11-18 (×2): qty 1

## 2020-11-18 NOTE — Progress Notes (Addendum)
Admit Date: 11/16/2020 Today's Date: 11/18/2020  Subjective: Postpartum Day 2: Cesarean Delivery Patient reports tolerating PO and no problems voiding.   Infant in SCN  Objective: Vital signs in last 24 hours: Temp:  [97.7 F (36.5 C)-98.6 F (37 C)] 98.3 F (36.8 C) (09/17 0830) Pulse Rate:  [76-83] 83 (09/17 0830) Resp:  [17-20] 18 (09/17 0830) BP: (114-156)/(70-91) 131/84 (09/17 0830) SpO2:  [98 %-100 %] 100 % (09/17 0830)  Physical Exam:  General: alert, cooperative, and no distress Lochia: appropriate Uterine Fundus: firm Incision: healing well DVT Evaluation: No evidence of DVT seen on physical exam.  Recent Labs    11/17/20 0624  HGB 10.1*  HCT 29.8*    Assessment/Plan: Status post Cesarean section. Doing well postoperatively.  cHTN, most BPs normal. No s/sx preeclampsia.  Taking Procardia 30 mg daily. Continue current care. No s/sx PPD Infant in SCN, on IV ABX, cont to monitor today Anemia, take PNV and iron Breast pumping at this time S/p BTL  [name] 11/18/2020, 9:44 AM

## 2020-11-19 DIAGNOSIS — Z302 Encounter for sterilization: Secondary | ICD-10-CM

## 2020-11-19 MED ORDER — BISACODYL 10 MG RE SUPP
10.0000 mg | Freq: Once | RECTAL | Status: AC
  Administered 2020-11-19: 10 mg via RECTAL
  Filled 2020-11-19: qty 1

## 2020-11-19 MED ORDER — NIFEDIPINE ER 30 MG PO TB24: 30.0000 mg | ORAL_TABLET | Freq: Every day | ORAL | 2 refills | Status: DC

## 2020-11-19 MED ORDER — HYDROCODONE-ACETAMINOPHEN 5-325 MG PO TABS: 1.0000 | ORAL_TABLET | ORAL | 0 refills | Status: DC | PRN

## 2020-11-19 NOTE — Progress Notes (Signed)
Pt. Discharge Teaching with F/U appointments completed and Pt. V/O. Pt. Is Discharged to home but is Rooming in with her Infant who is under the care of SCN.

## 2020-11-19 NOTE — Discharge Instructions (Signed)
Call for Temp. Greater than 100.4  No Tampons, Douches or Sex for 6 weeks. No tub bath for 6 weeks. Go to E.R. For any Shortness of Breath, chest Pain or saturating one or more pads in one hour and for any clots larger than a small lemon. Feelings of depression that make you feel unable to take care of yourself and/or baby. Do not lift anything heavier than your baby. No driving for 2 weeks and/or while you are taking Narcotic medication. When On Q Pump is empty, remove dressing and pull gently on tube close to your abdomen to remove the Tubing. Cover with Band-Aide after removal. Call Provider for any concerns. Leave your dressing on your C- Section site until you go to your F/U appointment. Notify Provider of any drainage, redness, unusual pain at incision site.

## 2020-11-19 NOTE — Progress Notes (Signed)
Admit Date: 11/16/2020 Today's Date: 11/19/2020  Subjective: Postpartum Day 3: Cesarean Delivery Patient reports tolerating PO and no problems voiding.    Objective: Vital signs in last 24 hours: Temp:  [97.9 F (36.6 C)-98.2 F (36.8 C)] 98 F (36.7 C) (09/18 0942) Pulse Rate:  [72-85] 82 (09/18 0942) Resp:  [18-20] 18 (09/18 0942) BP: (132-155)/(76-99) 139/99 (09/18 0942) SpO2:  [99 %-100 %] 100 % (09/18 0942)  Physical Exam:  General: alert, cooperative, and no distress Lochia: appropriate Uterine Fundus: firm Incision: healing well DVT Evaluation: No evidence of DVT seen on physical exam.  Recent Labs    11/17/20 0624  HGB 10.1*  HCT 29.8*    Assessment/Plan: Status post Cesarean section. Doing well postoperatively.  Discharge home with standard precautions and return to clinic in 4-6 weeks.  [name] 11/19/2020, 10:35 AM

## 2020-11-20 LAB — SURGICAL PATHOLOGY

## 2020-11-21 ENCOUNTER — Encounter: Payer: BC Managed Care – PPO | Admitting: Obstetrics & Gynecology

## 2020-11-22 ENCOUNTER — Other Ambulatory Visit: Payer: BC Managed Care – PPO

## 2020-11-27 ENCOUNTER — Other Ambulatory Visit: Payer: BC Managed Care – PPO

## 2020-11-30 ENCOUNTER — Ambulatory Visit (INDEPENDENT_AMBULATORY_CARE_PROVIDER_SITE_OTHER): Payer: BC Managed Care – PPO | Admitting: Obstetrics & Gynecology

## 2020-11-30 ENCOUNTER — Encounter: Payer: Self-pay | Admitting: Obstetrics & Gynecology

## 2020-11-30 ENCOUNTER — Other Ambulatory Visit: Payer: Self-pay

## 2020-11-30 VITALS — BP 128/80 | Ht 66.0 in | Wt 163.0 lb

## 2020-11-30 DIAGNOSIS — Z98891 History of uterine scar from previous surgery: Secondary | ICD-10-CM

## 2020-11-30 NOTE — Progress Notes (Signed)
  Postoperative Follow-up Patient presents post op from recent Cesarean Section performed for Elective repeat, 2 weeks ago.   Subjective: Patient reports marked improvement in her immediate post op symptoms. Eating a regular diet without difficulty. The patient is not having any pain.  Activity: normal activities of daily living. Patient reports additional symptom's since surgery of appropriate lochia, no signs of depression, and no signs of mastitis.  Objective: BP 128/80   Ht 5\' 6"  (1.676 m)   Wt 163 lb (73.9 kg)   BMI 26.31 kg/m  Physical Exam Constitutional:      General: She is not in acute distress.    Appearance: She is well-developed.  Cardiovascular:     Rate and Rhythm: Normal rate.  Pulmonary:     Effort: Pulmonary effort is normal.  Abdominal:     General: There is no distension.     Palpations: Abdomen is soft.     Tenderness: There is no abdominal tenderness.     Comments: Incision Healing Well   Musculoskeletal:        General: Normal range of motion.  Neurological:     Mental Status: She is alert and oriented to person, place, and time.     Cranial Nerves: No cranial nerve deficit.  Skin:    General: Skin is warm and dry.    Assessment: s/p : Cesarean Section for Elective repeat progressing well  Plan: Patient has done well after her Cesarean Section with no apparent complications.  I have discussed the post-operative course to date, and the expected progress moving forward.  The patient understands what complications to be concerned about.  I will see the patient in routine follow up, or sooner if needed.    Activity plan: No heavy lifting.Marland Kitchen  Pelvic rest. She desires bilateral tubal ligation(done) for postpartum contraception. Infant feeding well, breast  [name] 11/30/2020, 10:49 AM

## 2020-12-28 ENCOUNTER — Ambulatory Visit (INDEPENDENT_AMBULATORY_CARE_PROVIDER_SITE_OTHER): Payer: BC Managed Care – PPO | Admitting: Obstetrics & Gynecology

## 2020-12-28 ENCOUNTER — Other Ambulatory Visit: Payer: Self-pay

## 2020-12-28 ENCOUNTER — Encounter: Payer: Self-pay | Admitting: Obstetrics & Gynecology

## 2020-12-28 DIAGNOSIS — Z98891 History of uterine scar from previous surgery: Secondary | ICD-10-CM

## 2020-12-28 NOTE — Progress Notes (Signed)
  OBSTETRICS POSTPARTUM CLINIC PROGRESS NOTE  Subjective:     Kristine Arroyo is a 45 y.o. [phone] female who presents for a postpartum visit. She is 6 weeks postpartum following a Term pregnancy, Single fetus, or Pregnancy complicated by: AMA and delivery by  CS w BTL .  I have fully reviewed the prenatal and intrapartum course. Anesthesia: spinal.  Postpartum course has been complicated by uncomplicated.  Baby is feeding by Breast.  Bleeding: patient has not  resumed menses.  Bowel function is normal. Bladder function is normal.  Patient is not sexually active. Contraception method desired is tubal ligation.  Postpartum depression screening: negative. Edinburgh 4.  The following portions of the patient's history were reviewed and updated as appropriate: allergies, current medications, past family history, past medical history, past social history, past surgical history, and problem list.  Review of Systems Pertinent items are noted in HPI.  Objective:    BP 100/60   Ht 5\' 6"  (1.676 m)   Wt 161 lb (73 kg)   Breastfeeding Yes   BMI 25.99 kg/m   General:  alert and no distress   Breasts:  inspection negative, no nipple discharge or bleeding, no masses or nodularity palpable  Lungs: clear to auscultation bilaterally  Heart:  regular rate and rhythm, S1, S2 normal, no murmur, click, rub or gallop  Abdomen: soft, non-tender; bowel sounds normal; no masses,  no organomegaly.  Well healed Pfannenstiel incision   Vulva:  normal  Vagina: normal vagina, no discharge, exudate, lesion, or erythema  Cervix:  no cervical motion tenderness and no lesions  Corpus: normal size, contour, position, consistency, mobility, non-tender  Adnexa:  normal adnexa and no mass, fullness, tenderness  Rectal Exam: Not performed.          Assessment:  Post Partum Care visit 1. Postpartum care following cesarean delivery  2. S/P cesarean section   Plan:  See orders and Patient Instructions Resume all  normal activities Follow up in: 3 months or as needed.   [name], MD, [facility], [facility] 12/28/2020  9:46 AM

## 2021-01-11 ENCOUNTER — Ambulatory Visit (INDEPENDENT_AMBULATORY_CARE_PROVIDER_SITE_OTHER): Payer: BC Managed Care – PPO | Admitting: Dermatology

## 2021-01-11 ENCOUNTER — Other Ambulatory Visit: Payer: Self-pay

## 2021-01-11 DIAGNOSIS — D229 Melanocytic nevi, unspecified: Secondary | ICD-10-CM

## 2021-01-11 DIAGNOSIS — L821 Other seborrheic keratosis: Secondary | ICD-10-CM

## 2021-01-11 DIAGNOSIS — D1800 Hemangioma unspecified site: Secondary | ICD-10-CM

## 2021-01-11 DIAGNOSIS — L570 Actinic keratosis: Secondary | ICD-10-CM

## 2021-01-11 DIAGNOSIS — D2371 Other benign neoplasm of skin of right lower limb, including hip: Secondary | ICD-10-CM | POA: Diagnosis not present

## 2021-01-11 DIAGNOSIS — Z1283 Encounter for screening for malignant neoplasm of skin: Secondary | ICD-10-CM | POA: Diagnosis not present

## 2021-01-11 DIAGNOSIS — L814 Other melanin hyperpigmentation: Secondary | ICD-10-CM | POA: Diagnosis not present

## 2021-01-11 DIAGNOSIS — D239 Other benign neoplasm of skin, unspecified: Secondary | ICD-10-CM

## 2021-01-11 DIAGNOSIS — L578 Other skin changes due to chronic exposure to nonionizing radiation: Secondary | ICD-10-CM

## 2021-01-11 NOTE — Progress Notes (Signed)
   Follow-Up Visit   Subjective  Kristine Arroyo is a 45 y.o. female who presents for the following: Total body skin exam and check spots (L shoulder, 52m, dry spot/Face, few months dry spots). The patient presents for Total-Body Skin Exam (TBSE) for skin cancer screening and mole check.  The following portions of the chart were reviewed this encounter and updated as appropriate:   Tobacco  Allergies  Meds  Problems  Med Hx  Surg Hx  Fam Hx     Review of Systems:  No other skin or systemic complaints except as noted in HPI or Assessment and Plan.  Objective  Well appearing patient in no apparent distress; mood and affect are within normal limits.  A full examination was performed including scalp, head, eyes, ears, nose, lips, neck, chest, axillae, abdomen, back, buttocks, bilateral upper extremities, bilateral lower extremities, hands, feet, fingers, toes, fingernails, and toenails. All findings within normal limits unless otherwise noted below.  R face, R ear x 5 (5) Pink scaly macules   R med pretibial Firm pink/brown papulenodule with dimple sign.    Assessment & Plan   Lentigines - Scattered tan macules - Due to sun exposure - Benign-appearing, observe - Recommend daily broad spectrum sunscreen SPF 30+ to sun-exposed areas, reapply every 2 hours as needed. - Call for any changes  Seborrheic Keratoses - Stuck-on, waxy, tan-brown papules and/or plaques  - Benign-appearing - Discussed benign etiology and prognosis. - Observe - Call for any changes - L post shoulder  Melanocytic Nevi - Tan-brown and/or pink-flesh-colored symmetric macules and papules - Benign appearing on exam today - Observation - Call clinic for new or changing moles - Recommend daily use of broad spectrum spf 30+ sunscreen to sun-exposed areas.   Hemangiomas - Red papules - Discussed benign nature - Observe - Call for any changes  Actinic Damage - Chronic condition, secondary to  cumulative UV/sun exposure - diffuse scaly erythematous macules with underlying dyspigmentation - Recommend daily broad spectrum sunscreen SPF 30+ to sun-exposed areas, reapply every 2 hours as needed.  - Staying in the shade or wearing long sleeves, sun glasses (UVA+UVB protection) and wide brim hats (4-inch brim around the entire circumference of the hat) are also recommended for sun protection.  - Call for new or changing lesions.  Skin cancer screening performed today.  AK (actinic keratosis) (5) R face, R ear x 5  Destruction of lesion - R face, R ear x 5 Complexity: simple   Destruction method: cryotherapy   Informed consent: discussed and consent obtained   Timeout:  patient name, date of birth, surgical site, and procedure verified Lesion destroyed using liquid nitrogen: Yes   Region frozen until ice ball extended beyond lesion: Yes   Outcome: patient tolerated procedure well with no complications   Post-procedure details: wound care instructions given    Dermatofibroma R med pretibial Benign-appearing.  Observation.  Call clinic for new or changing lesions.  Recommend daily use of broad spectrum spf 30+ sunscreen to sun-exposed areas.    Skin cancer screening  Return in about 1 year (around 01/11/2022) for TBSE, Hx of AKs.  I, [name], RMA, am acting as scribe for Sarina Ser, MD . Documentation: I have reviewed the above documentation for accuracy and completeness, and I agree with the above.  Sarina Ser, MD

## 2021-01-11 NOTE — Patient Instructions (Addendum)

## 2021-01-16 ENCOUNTER — Encounter: Payer: Self-pay | Admitting: Dermatology

## 2021-08-14 DIAGNOSIS — F4323 Adjustment disorder with mixed anxiety and depressed mood: Secondary | ICD-10-CM | POA: Diagnosis not present

## 2021-08-24 DIAGNOSIS — F4323 Adjustment disorder with mixed anxiety and depressed mood: Secondary | ICD-10-CM | POA: Diagnosis not present

## 2021-09-11 DIAGNOSIS — F4323 Adjustment disorder with mixed anxiety and depressed mood: Secondary | ICD-10-CM | POA: Diagnosis not present

## 2021-09-28 DIAGNOSIS — F4323 Adjustment disorder with mixed anxiety and depressed mood: Secondary | ICD-10-CM | POA: Diagnosis not present

## 2021-10-19 DIAGNOSIS — F4323 Adjustment disorder with mixed anxiety and depressed mood: Secondary | ICD-10-CM | POA: Diagnosis not present

## 2022-01-16 ENCOUNTER — Encounter: Payer: Self-pay | Admitting: Dermatology

## 2022-01-16 ENCOUNTER — Ambulatory Visit (INDEPENDENT_AMBULATORY_CARE_PROVIDER_SITE_OTHER): Payer: BC Managed Care – PPO | Admitting: Dermatology

## 2022-01-16 DIAGNOSIS — Z1283 Encounter for screening for malignant neoplasm of skin: Secondary | ICD-10-CM | POA: Diagnosis not present

## 2022-01-16 DIAGNOSIS — L821 Other seborrheic keratosis: Secondary | ICD-10-CM

## 2022-01-16 DIAGNOSIS — L578 Other skin changes due to chronic exposure to nonionizing radiation: Secondary | ICD-10-CM

## 2022-01-16 DIAGNOSIS — L570 Actinic keratosis: Secondary | ICD-10-CM | POA: Diagnosis not present

## 2022-01-16 DIAGNOSIS — D1801 Hemangioma of skin and subcutaneous tissue: Secondary | ICD-10-CM | POA: Diagnosis not present

## 2022-01-16 DIAGNOSIS — L814 Other melanin hyperpigmentation: Secondary | ICD-10-CM

## 2022-01-16 DIAGNOSIS — L820 Inflamed seborrheic keratosis: Secondary | ICD-10-CM

## 2022-01-16 DIAGNOSIS — D229 Melanocytic nevi, unspecified: Secondary | ICD-10-CM

## 2022-01-16 NOTE — Patient Instructions (Addendum)
Cryotherapy Aftercare  Wash gently with soap and water everyday.   Apply Vaseline and Band-Aid daily until healed.     Due to recent changes in healthcare laws, you may see results of your pathology and/or laboratory studies on MyChart before the doctors have had a chance to review them. We understand that in some cases there may be results that are confusing or concerning to you. Please understand that not all results are received at the same time and often the doctors may need to interpret multiple results in order to provide you with the best plan of care or course of treatment. Therefore, we ask that you please give us 2 business days to thoroughly review all your results before contacting the office for clarification. Should we see a critical lab result, you will be contacted sooner.   If You Need Anything After Your Visit  If you have any questions or concerns for your doctor, please call our main line at [phone] and press option 4 to reach your doctor's medical assistant. If no one answers, please leave a voicemail as directed and we will return your call as soon as possible. Messages left after 4 pm will be answered the following business day.   You may also send us a message via MyChart. We typically respond to MyChart messages within 1-2 business days.  For prescription refills, please ask your pharmacy to contact our office. Our fax number is [phone].  If you have an urgent issue when the clinic is closed that cannot wait until the next business day, you can page your doctor at the number below.    Please note that while we do our best to be available for urgent issues outside of office hours, we are not available 24/7.   If you have an urgent issue and are unable to reach us, you may choose to seek medical care at your doctor's office, retail clinic, urgent care center, or emergency room.  If you have a medical emergency, please immediately call 911 or go to the  emergency department.  Pager Numbers  - Dr. Kowalski: [phone]  - Dr. Moye: [phone]  - Dr. Stewart: [phone]  In the event of inclement weather, please call our main line at [phone] for an update on the status of any delays or closures.  Dermatology Medication Tips: Please keep the boxes that topical medications come in in order to help keep track of the instructions about where and how to use these. Pharmacies typically print the medication instructions only on the boxes and not directly on the medication tubes.   If your medication is too expensive, please contact our office at [phone] option 4 or send us a message through MyChart.   We are unable to tell what your co-pay for medications will be in advance as this is different depending on your insurance coverage. However, we may be able to find a substitute medication at lower cost or fill out paperwork to get insurance to cover a needed medication.   If a prior authorization is required to get your medication covered by your insurance company, please allow us 1-2 business days to complete this process.  Drug prices often vary depending on where the prescription is filled and some pharmacies may offer cheaper prices.  The website www.goodrx.com contains coupons for medications through different pharmacies. The prices here do not account for what the cost may be with help from insurance (it may be cheaper with your insurance), but the website can   give you the price if you did not use any insurance.  - You can print the associated coupon and take it with your prescription to the pharmacy.  - You may also stop by our office during regular business hours and pick up a GoodRx coupon card.  - If you need your prescription sent electronically to a different pharmacy, notify our office through [facility] MyChart or by phone at [phone] option 4.     Si Usted Necesita Algo Despus de Su Visita  Tambin puede  enviarnos un mensaje a travs de MyChart. Por lo general respondemos a los mensajes de MyChart en el transcurso de 1 a 2 das hbiles.  Para renovar recetas, por favor pida a su farmacia que se ponga en contacto con nuestra oficina. Nuestro nmero de fax es el [phone].  Si tiene un asunto urgente cuando la clnica est cerrada y que no puede esperar hasta el siguiente da hbil, puede llamar/localizar a su doctor(a) al nmero que aparece a continuacin.   Por favor, tenga en cuenta que aunque hacemos todo lo posible para estar disponibles para asuntos urgentes fuera del horario de oficina, no estamos disponibles las 24 horas del da, los 7 das de la semana.   Si tiene un problema urgente y no puede comunicarse con nosotros, puede optar por buscar atencin mdica  en el consultorio de su doctor(a), en una clnica privada, en un centro de atencin urgente o en una sala de emergencias.  Si tiene una emergencia mdica, por favor llame inmediatamente al 911 o vaya a la sala de emergencias.  Nmeros de bper  - Dr. Kowalski: [phone]  - Dra. Moye: [phone]  - Dra. Stewart: [phone]  En caso de inclemencias del tiempo, por favor llame a nuestra lnea principal al [phone] para una actualizacin sobre el estado de cualquier retraso o cierre.  Consejos para la medicacin en dermatologa: Por favor, guarde las cajas en las que vienen los medicamentos de uso tpico para ayudarle a seguir las instrucciones sobre dnde y cmo usarlos. Las farmacias generalmente imprimen las instrucciones del medicamento slo en las cajas y no directamente en los tubos del medicamento.   Si su medicamento es muy caro, por favor, pngase en contacto con nuestra oficina llamando al [phone] y presione la opcin 4 o envenos un mensaje a travs de MyChart.   No podemos decirle cul ser su copago por los medicamentos por adelantado ya que esto es diferente dependiendo de la cobertura de su seguro.  Sin embargo, es posible que podamos encontrar un medicamento sustituto a menor costo o llenar un formulario para que el seguro cubra el medicamento que se considera necesario.   Si se requiere una autorizacin previa para que su compaa de seguros cubra su medicamento, por favor permtanos de 1 a 2 das hbiles para completar este proceso.  Los precios de los medicamentos varan con frecuencia dependiendo del lugar de dnde se surte la receta y alguna farmacias pueden ofrecer precios ms baratos.  El sitio web www.goodrx.com tiene cupones para medicamentos de diferentes farmacias. Los precios aqu no tienen en cuenta lo que podra costar con la ayuda del seguro (puede ser ms barato con su seguro), pero el sitio web puede darle el precio si no utiliz ningn seguro.  - Puede imprimir el cupn correspondiente y llevarlo con su receta a la farmacia.  - Tambin puede pasar por nuestra oficina durante el horario de atencin regular y recoger una tarjeta de cupones de GoodRx.  -   Si necesita que su receta se enve electrnicamente a una farmacia diferente, informe a nuestra oficina a travs de MyChart de [place] o por telfono llamando al [phone] y presione la opcin 4.  

## 2022-01-16 NOTE — Progress Notes (Signed)
Follow-Up Visit   Subjective  Kristine Arroyo is a 46 y.o. female who presents for the following: Total body skin exam (Hx of AKs) and check spot (R leg, ~24m no symptoms). The patient presents for Total-Body Skin Exam (TBSE) for skin cancer screening and mole check.  The patient has spots, moles and lesions to be evaluated, some may be new or changing and the patient has concerns that these could be cancer.   The following portions of the chart were reviewed this encounter and updated as appropriate:   Tobacco  Allergies  Meds  Problems  Med Hx  Surg Hx  Fam Hx     Review of Systems:  No other skin or systemic complaints except as noted in HPI or Assessment and Plan.  Objective  Well appearing patient in no apparent distress; mood and affect are within normal limits.  A full examination was performed including scalp, head, eyes, ears, nose, lips, neck, chest, axillae, abdomen, back, buttocks, bilateral upper extremities, bilateral lower extremities, hands, feet, fingers, toes, fingernails, and toenails. All findings within normal limits unless otherwise noted below.  R temple x 2 (2) Pink scaly macules  R lat thigh x 1 Stuck on waxy paps with erythema  L ant ankle Red pap   Assessment & Plan   Lentigines - Scattered tan macules - Due to sun exposure - Benign-appearing, observe - Recommend daily broad spectrum sunscreen SPF 30+ to sun-exposed areas, reapply every 2 hours as needed. - Call for any changes - arms, back  Seborrheic Keratoses - Stuck-on, waxy, tan-brown papules and/or plaques  - Benign-appearing - Discussed benign etiology and prognosis. - Observe - Call for any changes - trunk, legs, arms  Melanocytic Nevi - Tan-brown and/or pink-flesh-colored symmetric macules and papules - Benign appearing on exam today - Observation - Call clinic for new or changing moles - Recommend daily use of broad spectrum spf 30+ sunscreen to sun-exposed areas.  -  trunk  Hemangiomas - Red papules - Discussed benign nature - Observe - Call for any changes - trunk  Actinic Damage - Chronic condition, secondary to cumulative UV/sun exposure - diffuse scaly erythematous macules with underlying dyspigmentation - Recommend daily broad spectrum sunscreen SPF 30+ to sun-exposed areas, reapply every 2 hours as needed.  - Staying in the shade or wearing long sleeves, sun glasses (UVA+UVB protection) and wide brim hats (4-inch brim around the entire circumference of the hat) are also recommended for sun protection.  - Call for new or changing lesions.  Skin cancer screening performed today.   AK (actinic keratosis) (2) R temple x 2  Destruction of lesion - R temple x 2 Complexity: simple   Destruction method: cryotherapy   Informed consent: discussed and consent obtained   Timeout:  patient name, date of birth, surgical site, and procedure verified Lesion destroyed using liquid nitrogen: Yes   Region frozen until ice ball extended beyond lesion: Yes   Outcome: patient tolerated procedure well with no complications   Post-procedure details: wound care instructions given    Inflamed seborrheic keratosis R lat thigh x 1 Symptomatic, irritating, patient would like treated. Destruction of lesion - R lat thigh x 1 Complexity: simple   Destruction method: cryotherapy   Informed consent: discussed and consent obtained   Timeout:  patient name, date of birth, surgical site, and procedure verified Lesion destroyed using liquid nitrogen: Yes   Region frozen until ice ball extended beyond lesion: Yes   Outcome: patient tolerated procedure well  with no complications   Post-procedure details: wound care instructions given    Hemangioma of skin L ant ankle Bx proven, benign, observe  Return in about 1 year (around 01/17/2023) for TBSE, Hx of AKs.  I, [name], RMA, am acting as scribe for Sarina Ser, MD . Documentation: I have reviewed the  above documentation for accuracy and completeness, and I agree with the above.  Sarina Ser, MD

## 2022-01-21 DIAGNOSIS — K641 Second degree hemorrhoids: Secondary | ICD-10-CM | POA: Diagnosis not present

## 2022-01-21 DIAGNOSIS — K645 Perianal venous thrombosis: Secondary | ICD-10-CM | POA: Diagnosis not present

## 2022-02-04 ENCOUNTER — Encounter: Payer: Self-pay | Admitting: Dermatology

## 2022-02-10 IMAGING — US US MFM OB DETAIL+14 WK
1 series · 13 of 28 positions shown · non-contrast
Comparison: none

[Series 1: us mfm ob detail+14 wk · 13 of 90 slices shown]
[im 4/90]
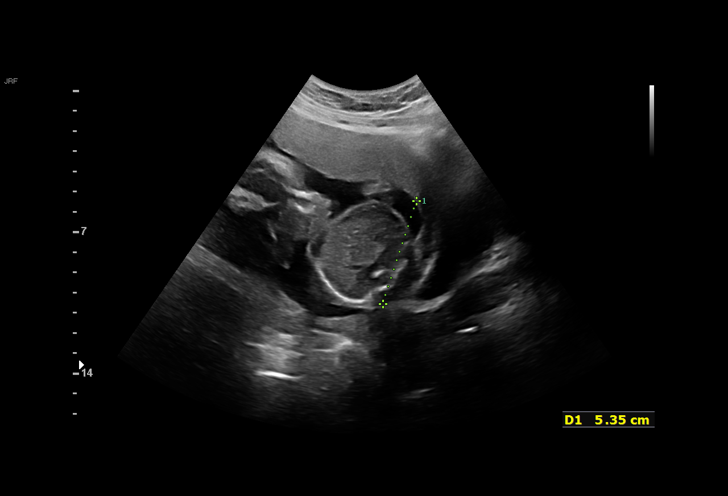
[im 10/90]
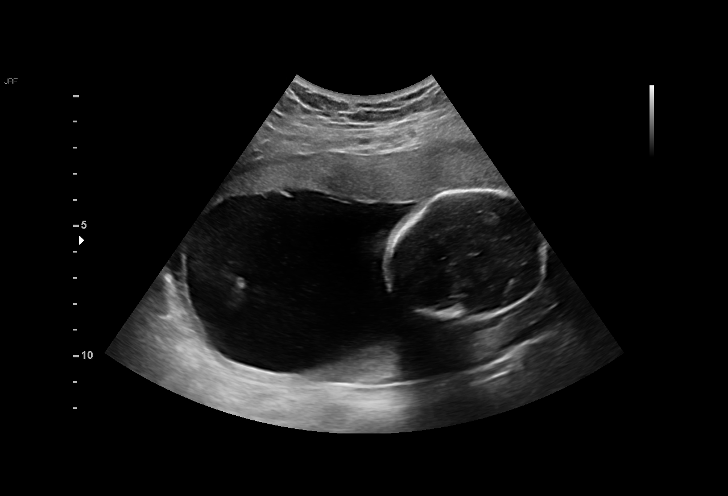
[im 17/90]
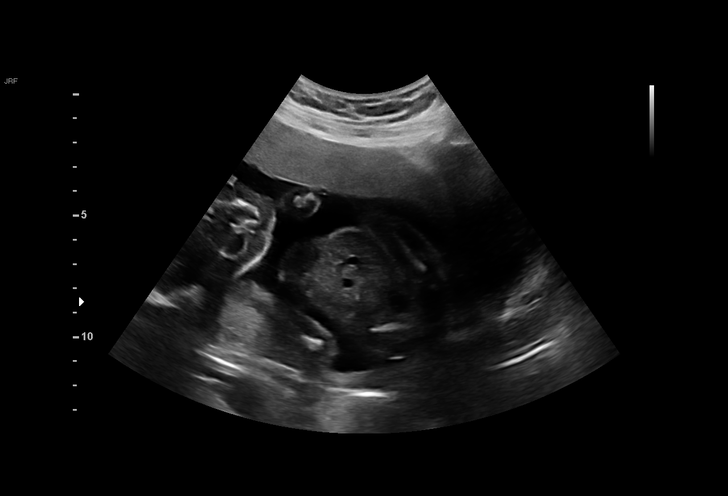
[im 24/90]
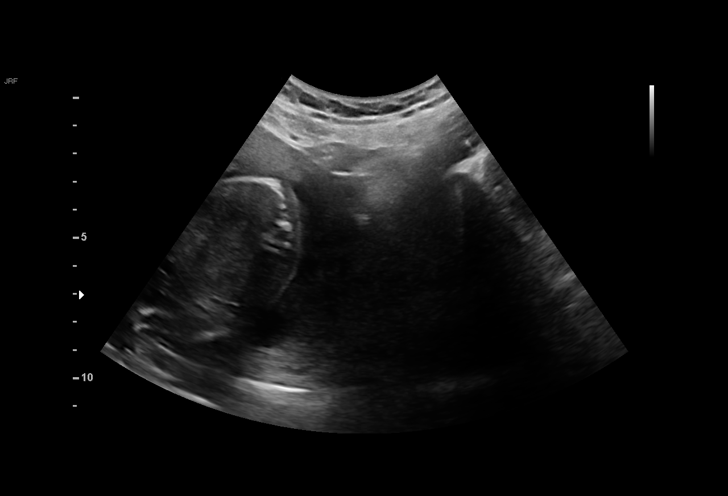
[im 30/90]
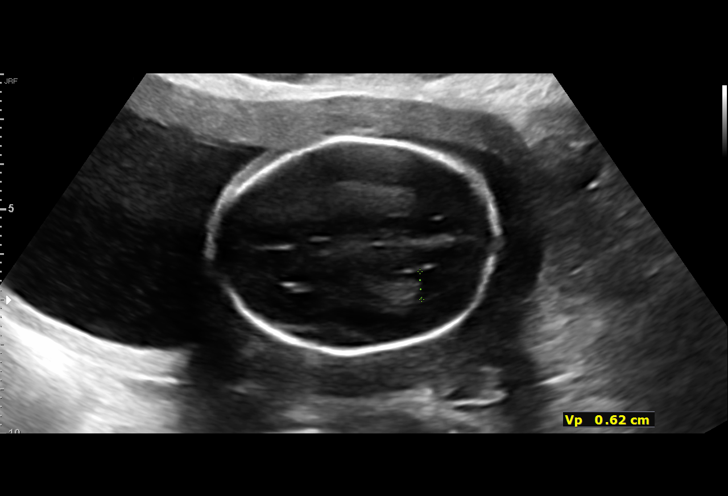
[im 37/90]
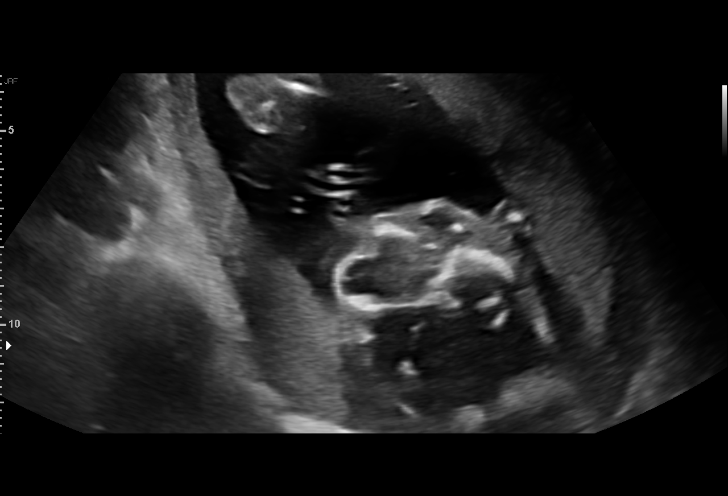
[im 47/90]
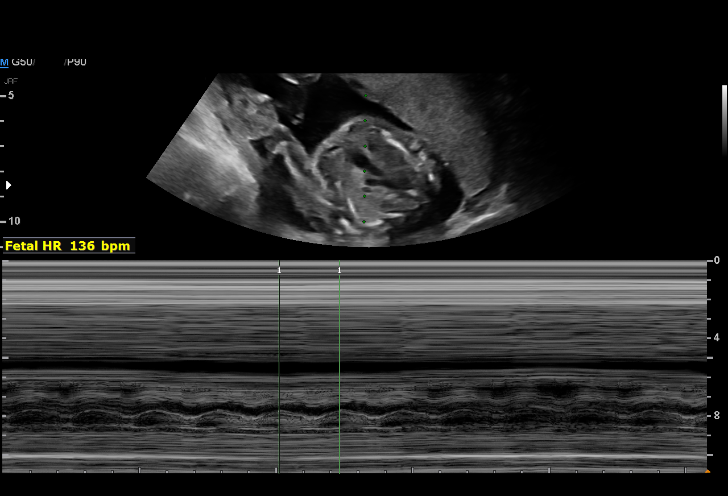
[im 53/90]
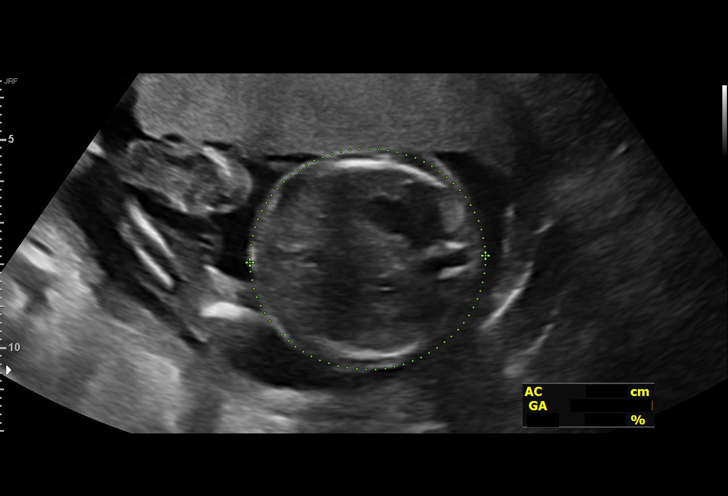
[im 60/90]
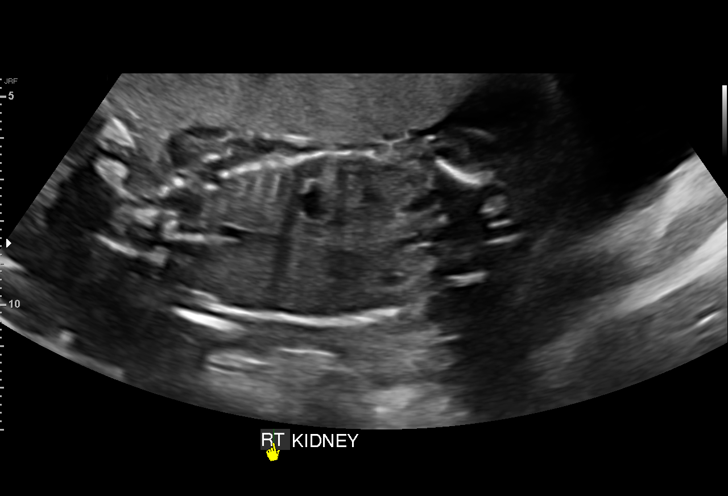
[im 66/90]
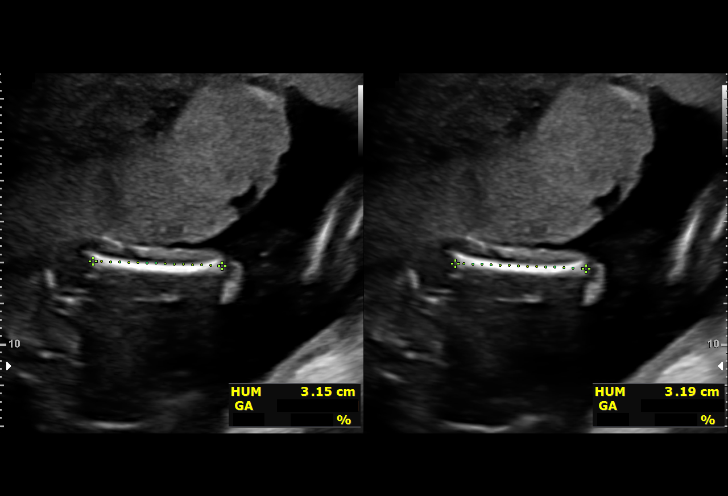
[im 73/90]
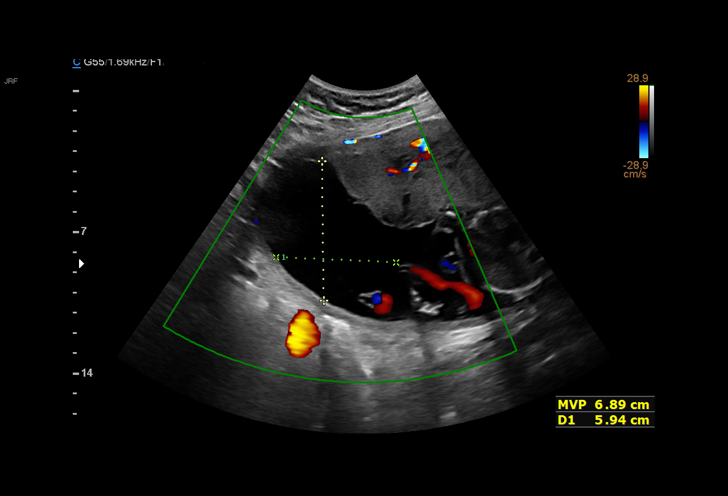
[im 80/90]
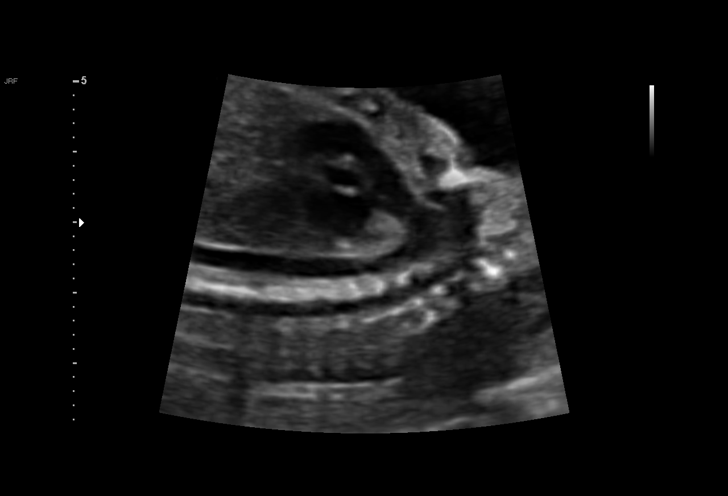
[im 86/90]
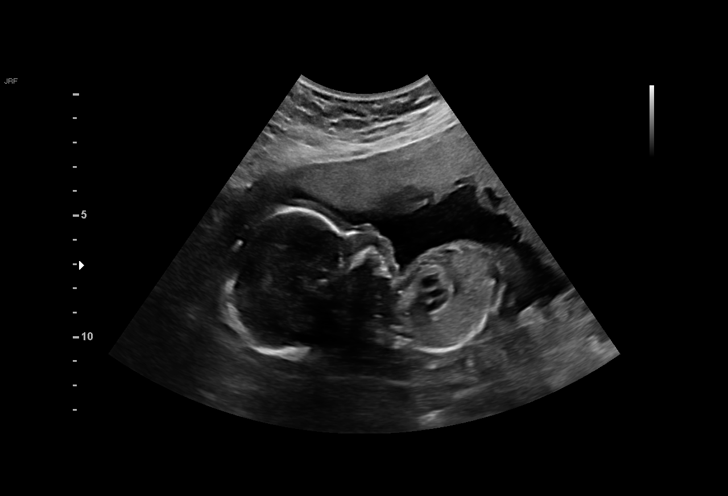

[13 of 28 positions shown; findings below may reference images not displayed]

Indications

 20 weeks gestation of pregnancy
 Advanced maternal age multigravida 35+,
 second trimester
 History of cesarean delivery, currently
 pregnant
Fetal Evaluation

 Num Of Fetuses:         1
 Fetal Heart Rate(bpm):  136
 Cardiac Activity:       Observed
 Presentation:           Breech
 Placenta:               Anterior
 P. Cord Insertion:      Not well visualized

                             Largest Pocket(cm)

Biometry

 BPD:      47.5  mm     G. Age:  20w 3d         40  %    CI:        68.28   %    70 - 86
                                                         FL/HC:      19.5   %    15.9 -
 HC:      183.8  mm     G. Age:  20w 5d         49  %    HC/AC:      1.08        1.06 -
 AC:      169.9  mm     G. Age:  22w 0d         85  %    FL/BPD:     75.6   %
 FL:       35.9  mm     G. Age:  21w 3d         70  %    FL/AC:      21.1   %    20 - 24
 HUM:      31.9  mm     G. Age:  20w 5d         52  %
 CER:      21.9  mm     G. Age:  20w 4d         67  %

 LV:        6.2  mm
 CM:        5.4  mm
 Est. FW:     433  gm    0 lb 15 oz      92  %
OB History

 Gravidity:    6         Term:   1        Prem:   0        SAB:   2
 TOP:          0       Ectopic:  1        Living: 1
Gestational Age

 LMP:           20w 4d        Date:  02/27/20                 EDD:   12/03/20
 U/S Today:     21w 1d                                        EDD:   11/29/20
 Best:          20w 4d     Det. By:  LMP  (02/27/20)          EDD:   12/03/20
Anatomy

 Cranium:               Appears normal         Aortic Arch:            Appears normal
 Cavum:                 Appears normal         Ductal Arch:            Appears normal
 Ventricles:            Appears normal         Diaphragm:              Appears normal
 Choroid Plexus:        Appears normal         Stomach:                Appears normal, left
                                                                       sided
 Cerebellum:            Appears normal         Abdomen:                Appears normal
 Posterior Fossa:       Appears normal         Abdominal Wall:         Appears nml (cord
                                                                       insert, abd wall)
 Nuchal Fold:           Not applicable (>20    Cord Vessels:           Appears normal (3
                        wks GA)                                        vessel cord)
 Face:                  Appears normal         Kidneys:                Appear normal
                        (orbits and profile)
 Lips:                  Appears normal         Bladder:                Appears normal
 Thoracic:              Appears normal         Spine:                  Not well visualized
 Heart:                 Appears normal         Upper Extremities:      Appears normal
                        (4CH, axis, and
                        situs)
 RVOT:                  Appears normal         Lower Extremities:      Appears normal
 LVOT:                  Appears normal

 Other:  Fetus appears to be a male.
Cervix Uterus Adnexa

 Cervix
 Length:           3.15  cm.

 Right Ovary
 Size(cm)       3.7  x   1.1    x  1.4       Vol(ml):
Impression

 G6 P1.  Patient is here for fetal anatomy scan.
 Advanced maternal age. On cell-free fetal DNA screening,
 the risks of fetal aneuploidies are not increased.
 Obstetric history is significant for a term cesarean delivery in
 8768 of a female infant. She had one ectopic pregnancy in
 0854 and 2 SABs in 6666.
 She reports no chronic medical conditions.
 We performed a fetal anatomy scan. No markers of
 aneuploidies or fetal structural defects are seen. Fetal
 biometry is consistent with her previously-established dates.
 Amniotic fluid is normal and good fetal activity is seen.
 Patient understands the limitations of ultrasound in detecting
 fetal anomalies.
Recommendations

 -An appointment was made for her to return in 4 weeks for
 completion of fetal anatomy (fetal spine and PCI).
                 Lilly, Parsa

## 2022-02-11 ENCOUNTER — Encounter: Payer: Self-pay | Admitting: Advanced Practice Midwife

## 2022-02-11 ENCOUNTER — Ambulatory Visit (INDEPENDENT_AMBULATORY_CARE_PROVIDER_SITE_OTHER): Payer: BC Managed Care – PPO | Admitting: Advanced Practice Midwife

## 2022-02-11 VITALS — BP 136/95 | HR 70 | Ht 66.0 in | Wt 148.0 lb

## 2022-02-11 DIAGNOSIS — Z13220 Encounter for screening for lipoid disorders: Secondary | ICD-10-CM | POA: Diagnosis not present

## 2022-02-11 DIAGNOSIS — Z131 Encounter for screening for diabetes mellitus: Secondary | ICD-10-CM | POA: Diagnosis not present

## 2022-02-11 DIAGNOSIS — Z01419 Encounter for gynecological examination (general) (routine) without abnormal findings: Secondary | ICD-10-CM

## 2022-02-11 DIAGNOSIS — Z1239 Encounter for other screening for malignant neoplasm of breast: Secondary | ICD-10-CM

## 2022-02-12 LAB — CBC WITH DIFFERENTIAL/PLATELET
Basophils Absolute: 0 10*3/uL (ref 0.0–0.2)
Basos: 0 %
EOS (ABSOLUTE): 0.1 10*3/uL (ref 0.0–0.4)
Eos: 2 %
Hematocrit: 36.3 % (ref 34.0–46.6)
Hemoglobin: 12.4 g/dL (ref 11.1–15.9)
Immature Grans (Abs): 0 10*3/uL (ref 0.0–0.1)
Immature Granulocytes: 0 %
Lymphocytes Absolute: 2.4 10*3/uL (ref 0.7–3.1)
Lymphs: 39 %
MCH: 29.2 pg (ref 26.6–33.0)
MCHC: 34.2 g/dL (ref 31.5–35.7)
MCV: 86 fL (ref 79–97)
Monocytes Absolute: 0.3 10*3/uL (ref 0.1–0.9)
Monocytes: 5 %
Neutrophils Absolute: 3.3 10*3/uL (ref 1.4–7.0)
Neutrophils: 54 %
Platelets: 164 10*3/uL (ref 150–450)
RBC: 4.24 x10E6/uL (ref 3.77–5.28)
RDW: 13.4 % (ref 11.7–15.4)
WBC: 6.2 10*3/uL (ref 3.4–10.8)

## 2022-02-12 LAB — COMPREHENSIVE METABOLIC PANEL
ALT: 6 IU/L (ref 0–32)
AST: 10 IU/L (ref 0–40)
Albumin/Globulin Ratio: 2.3 — ABNORMAL HIGH (ref 1.2–2.2)
Albumin: 4.8 g/dL (ref 3.9–4.9)
Alkaline Phosphatase: 51 IU/L (ref 44–121)
BUN/Creatinine Ratio: 14 (ref 9–23)
BUN: 13 mg/dL (ref 6–24)
Bilirubin Total: 0.2 mg/dL (ref 0.0–1.2)
CO2: 24 mmol/L (ref 20–29)
Calcium: 9.5 mg/dL (ref 8.7–10.2)
Chloride: 102 mmol/L (ref 96–106)
Creatinine, Ser: 0.92 mg/dL (ref 0.57–1.00)
Globulin, Total: 2.1 g/dL (ref 1.5–4.5)
Glucose: 85 mg/dL (ref 70–99)
Potassium: 4.1 mmol/L (ref 3.5–5.2)
Sodium: 140 mmol/L (ref 134–144)
Total Protein: 6.9 g/dL (ref 6.0–8.5)
eGFR: 78 mL/min/{1.73_m2} (ref >59)

## 2022-02-12 LAB — LIPID PANEL WITH LDL/HDL RATIO
Cholesterol, Total: 168 mg/dL (ref 100–199)
HDL: 54 mg/dL (ref >39)
LDL Chol Calc (NIH): 102 mg/dL — ABNORMAL HIGH (ref 0–99)
LDL/HDL Ratio: 1.9 ratio (ref 0.0–3.2)
Triglycerides: 62 mg/dL (ref 0–149)
VLDL Cholesterol Cal: 12 mg/dL (ref 5–40)

## 2022-02-12 LAB — HGB A1C W/O EAG: Hgb A1c MFr Bld: 5.1 % (ref 4.8–5.6)

## 2022-02-13 NOTE — Progress Notes (Signed)
Date of Service: 02/11/2022  Gynecology Annual Exam  PCP: Will Bonnet, MD  Chief Complaint:  Chief Complaint  Patient presents with   Annual Exam    History of Present Illness: Patient is a 46 y.o. [id number] presents for annual exam. The patient has complaint today of decreased libido in the past year. She has a 46 year old and a 14 1/46 year old. She has some vaginal dryness, occasional hot flashes. She has had some mood changes and is feeling better since taking supplements from [employer] (hormonious). She also takes their sleep aid and probiotic. She reports some increase in stress since being laid off long time job a month ago. She reports ongoing back and hip pain. She would prefer to see a DO, however, her insurance does not cover. She mentions a "meaty" appearance at introitus and wonders if she has any prolapse. She denies bladder concerns. She was seen by general surgery a few weeks ago at [facility] for thrombosed hemorrhoid.  LMP: Patient's last menstrual period was 01/17/2022. Average Interval: irregular,  every 25 to 35 days. Had one recent cycle after 17 days Duration of flow: 4 days Heavy Menses: starts heavy Clots: no Intermenstrual Bleeding: no Postcoital Bleeding: no Dysmenorrhea: no   The patient is sexually active. She currently uses tubal ligation for contraception. She reports some discomfort with sexual activity due to dryness.  The patient does perform self breast exams.  There is no notable family history of breast or ovarian cancer in her family.  The patient wears seatbelts: yes.   The patient has regular exercise: she is active with her children and has generally active lifestyle. She reports healthy diet and adequate hydration.    The patient denies current symptoms of depression.    Review of Systems: Review of Systems  Constitutional:  Negative for chills and fever.  HENT:  Negative for congestion, ear discharge, ear pain, hearing loss, sinus  pain and sore throat.   Eyes:  Negative for blurred vision and double vision.  Respiratory:  Negative for cough, shortness of breath and wheezing.   Cardiovascular:  Negative for chest pain, palpitations and leg swelling.  Gastrointestinal:  Negative for abdominal pain, blood in stool, constipation, diarrhea, heartburn, melena, nausea and vomiting.       Positive for hemorrhoid  Genitourinary:  Negative for dysuria, flank pain, frequency, hematuria and urgency.  Musculoskeletal:  Positive for back pain and joint pain. Negative for myalgias.  Skin:  Negative for itching and rash.  Neurological:  Negative for dizziness, tingling, tremors, sensory change, speech change, focal weakness, seizures, loss of consciousness, weakness and headaches.  Endo/Heme/Allergies:  Negative for environmental allergies. Does not bruise/bleed easily.       Positive for decreased libido  Psychiatric/Behavioral:  Negative for depression, hallucinations, memory loss, substance abuse and suicidal ideas. The patient is not nervous/anxious and does not have insomnia.     Past Medical History:  Patient Active Problem List   Diagnosis Date Noted   Admission for sterilization    History of cesarean delivery 11/16/2020   Indication for care in labor and delivery, antepartum 10/27/2020   Supervision of high risk pregnancy, antepartum 02/18/2019    Clinic [facility] Prenatal Labs  Dating LMPm confirmed by 7 wk [place] Blood type: O/Positive/-- (03/07 0851)   Genetic Screen    NIPS: nml XY Antibody:Negative (03/07 0851)  Anatomic [place] complete Rubella: 4.02 (03/07 0851)  Varicella: Immune  GTT Third trimester: 84 RPR: Non Reactive (03/07  0851)   Rhogam n/a HBsAg: Negative (03/07 0851)   TDaP vaccine   09/27/2020                Flu Shot: HIV: Non Reactive (03/07 0851)   Baby Food  Breast                              GBS: positive  Contraception  BTL Pap:05/01/20  CBB  no   CS/VBAC  R CS desired   Support Person husb          History of cesarean delivery affecting pregnancy 02/18/2019     - due to fetal intolerance of labor - desires repeat c-section - 05/29/20 OB/GYN  Counseling Note 46 y.o. [id number] at 64w1dwith Estimated Date of Delivery: 12/03/20 was seen today in office to discuss trial of labor after cesarean section (TOLAC) versus elective repeat cesarean delivery (ERCD). The following risks were discussed with the patient.  Risk of uterine rupture at term is 0.78 percent with TOLAC and 0.22 percent with ERCD. 1 in 10 uterine ruptures will result in neonatal death or neurological injury. The benefits of a trial of labor after cesarean (TOLAC) resulting in a vaginal birth after cesarean (VBAC) include the following: shorter length of hospital stay and postpartum recovery (in most cases); fewer complications, such as postpartum fever, wound or uterine infection, thromboembolism (blood clots in the leg or lung), need for blood transfusion and fewer neonatal breathing problems.  The risks of an attempted VBAC or TOLAC include the following: Risk of failed trial of labor after cesarean (TOLAC) without a vaginal birth after cesarean (VBAC) resulting in repeat cesarean delivery (RCD) in about 20 to 461percent of women who attempt VBAC.  Risk of rupture of uterus resulting in an emergency cesarean delivery. The risk of uterine rupture may be related in part to the type of uterine incision made during the first cesarean delivery. A previous transverse uterine incision has the lowest risk of rupture (0.2 to 1.5 percent risk). Vertical or T-shaped uterine incisions have a higher risk of uterine rupture (4 to 9 percent risk)The risk of fetal death is very low with both VBAC and elective repeat cesarean delivery (ERCD), but the likelihood of fetal death is higher with VBAC than with ERCD. Maternal death is very rare with either type of delivery.  The risks of an elective repeat cesarean delivery (ERCD) were reviewed with the patient  including but not limited to: 04/998 risk of uterine rupture which could have serious consequences, bleeding which may require transfusion; infection which may require antibiotics; injury to bowel, bladder or other surrounding organs (bowel, bladder, ureters); injury to the fetus; need for additional procedures including hysterectomy in the event of a life-threatening hemorrhage; thromboembolic phenomenon; abnormal placentation; incisional problems; death and other postoperative or anesthesia complications.        Gestational hypertension affecting sixth pregnancy 02/18/2019   Advanced maternal age in multigravida 02/18/2019    NIPT nml MFM [place] anatomy check '[ ]'$     Lumbago 12/17/2016   Celiac disease 10/22/2016    Past Surgical History:  Past Surgical History:  Procedure Laterality Date   CESAREAN SECTION N/A 05/03/2017   Procedure: CESAREAN SECTION;  Surgeon: HGae Dry MD;  Location: [facility] ORS;  Service: Obstetrics;  Laterality: N/A;   CESAREAN SECTION WITH BILATERAL TUBAL LIGATION Bilateral 11/16/2020   Procedure: CESAREAN SECTION WITH BILATERAL TUBAL LIGATION;  Surgeon: HKenton Kingfisher  Linton Ham, MD;  Location: [facility] ORS;  Service: Obstetrics;  Laterality: Bilateral;   HERNIA REPAIR Right 2011   inguinal   REFRACTIVE SURGERY  2011    Gynecologic History:  Patient's last menstrual period was 01/17/2022. Contraception: tubal ligation Last Pap: 2022 Results were:  no abnormalities  Last mammogram: no history  Obstetric History: [id number]  Family History:  Family History  Problem Relation Age of Onset   Diabetes Mellitus II Maternal Grandfather    Throat cancer Maternal Grandfather    Pancreatic cancer Paternal Grandmother    Diabetes Mellitus II Maternal Grandmother     Social History:  Social History   Socioeconomic History   Marital status: Married    Spouse name: Larkin Ina   Number of children: 1   Years of education: 16   Highest education level: Not on file   Occupational History   Occupation: Careers adviser  Tobacco Use   Smoking status: Former    Types: Cigarettes   Smokeless tobacco: Never   Tobacco comments:    stopped 2003  Vaping Use   Vaping Use: Never used  Substance and Sexual Activity   Alcohol use: Not Currently    Alcohol/week: 1.0 standard drink of alcohol    Types: 1 Glasses of wine per week   Drug use: No   Sexual activity: Yes    Birth control/protection: None  Other Topics Concern   Not on file  Social History Narrative   Not on file   Social Determinants of Health   Financial Resource Strain: Not on file  Food Insecurity: Not on file  Transportation Needs: Not on file  Physical Activity: Unknown (08/12/2018)   Exercise Vital Sign    Days of Exercise per Week: 0 days    Minutes of Exercise per Session: Not on file  Stress: Not on file  Social Connections: Not on file  Intimate Partner Violence: Not on file    Allergies:  Allergies  Allergen Reactions   Gluten Meal Nausea And Vomiting    Compares to food poisoning - celiac disease    Medications: Prior to Admission medications   Medication Sig Start Date End Date Taking? Authorizing Provider  hydrocortisone cream 1 % Apply 1 application  topically 2 (two) times daily as needed for itching.   Yes [provider]    Physical Exam Vitals: Blood pressure (!) 136/95, pulse 70, height '5\' 6"'$  (1.676 m), weight 148 lb (67.1 kg), last menstrual period 01/17/2022, currently breastfeeding.  General: NAD HEENT: normocephalic, anicteric Thyroid: no enlargement, no palpable nodules Pulmonary: No increased work of breathing, CTAB Cardiovascular: RRR, distal pulses 2+ Breast: Breast symmetrical, no tenderness, no palpable nodules or masses, no skin or nipple retraction present, no nipple discharge.  No axillary or supraclavicular lymphadenopathy. Abdomen: NABS, soft, non-tender, non-distended.  Umbilicus without lesions.  No hepatomegaly,  splenomegaly or masses palpable. No evidence of hernia  Genitourinary:  External: Normal external female genitalia.  Normal urethral meatus, normal Bartholin's and Skene's glands.    Vagina: Normal vaginal mucosa, no evidence of prolapse.   Extremities: no edema, erythema, or tenderness Neurologic: Grossly intact Psychiatric: mood appropriate, affect full   Assessment: 46 y.o. [id number] routine annual exam  Plan: Problem List Items Addressed This Visit   None Visit Diagnoses     Well woman exam with routine gynecological exam    -  Primary   Relevant Orders   CBC with Differential/Platelet (Completed)   Comprehensive metabolic panel (Completed)   Hgb A1c  w/o eAG (Completed)   Lipid Panel With LDL/HDL Ratio (Completed)   Screening for diabetes mellitus       Relevant Orders   Hgb A1c w/o eAG (Completed)   Screening, lipid       Relevant Orders   Lipid Panel With LDL/HDL Ratio (Completed)   Breast screening       Relevant Orders   MM 3D SCREEN BREAST BILATERAL       1) Mammogram - recommend yearly screening mammogram.  Mammogram Was ordered today  2) STI screening  was offered and declined  3) ASCCP guidelines and rationale discussed.  Patient opts for every 3 years screening interval  4) Contraception - the patient is currently using  tubal ligation.  She is happy with her current form of contraception and plans to continue  5) Colonoscopy -- Screening recommended starting at age 13 for average risk individuals, age 72 for individuals deemed at increased risk (including African Americans) and recommended to continue until age 79.  For patient age 73-85 individualized approach is recommended.  Gold standard screening is via colonoscopy, Cologuard screening is an acceptable alternative for patient unwilling or unable to undergo colonoscopy.  "Colorectal cancer screening for average?risk adults: 2018 guideline update from the [place]: A Cancer Journal for  Clinicians: Jul 31, 2016. She plans to follow up with [facility] for colonoscopy  6) Routine healthcare maintenance including cholesterol, diabetes screening discussed Ordered today  7) Return in about 1 year (around 02/12/2023) for annual established gyn.   Rod Can, [facility] 02/13/2022, 10:47 AM

## 2022-09-17 ENCOUNTER — Telehealth: Payer: 59 | Admitting: Physician Assistant

## 2022-09-17 DIAGNOSIS — R3989 Other symptoms and signs involving the genitourinary system: Secondary | ICD-10-CM

## 2022-09-17 MED ORDER — CEPHALEXIN 500 MG PO CAPS: 500.0000 mg | ORAL_CAPSULE | Freq: Two times a day (BID) | ORAL | 0 refills | Status: AC

## 2022-09-17 NOTE — Progress Notes (Signed)
I have spent 5 minutes in review of e-visit questionnaire, review and updating patient chart, medical decision making and response to patient.   William Cody Martin, PA-C    

## 2022-09-17 NOTE — Progress Notes (Signed)

## 2023-01-23 ENCOUNTER — Ambulatory Visit: Payer: 59 | Admitting: Dermatology

## 2023-01-23 DIAGNOSIS — D229 Melanocytic nevi, unspecified: Secondary | ICD-10-CM

## 2023-01-23 DIAGNOSIS — Z872 Personal history of diseases of the skin and subcutaneous tissue: Secondary | ICD-10-CM

## 2023-01-23 DIAGNOSIS — W908XXA Exposure to other nonionizing radiation, initial encounter: Secondary | ICD-10-CM

## 2023-01-23 DIAGNOSIS — Z1283 Encounter for screening for malignant neoplasm of skin: Secondary | ICD-10-CM | POA: Diagnosis not present

## 2023-01-23 DIAGNOSIS — L820 Inflamed seborrheic keratosis: Secondary | ICD-10-CM

## 2023-01-23 DIAGNOSIS — Z7189 Other specified counseling: Secondary | ICD-10-CM

## 2023-01-23 DIAGNOSIS — L578 Other skin changes due to chronic exposure to nonionizing radiation: Secondary | ICD-10-CM

## 2023-01-23 DIAGNOSIS — Z79899 Other long term (current) drug therapy: Secondary | ICD-10-CM

## 2023-01-23 DIAGNOSIS — L570 Actinic keratosis: Secondary | ICD-10-CM | POA: Diagnosis not present

## 2023-01-23 DIAGNOSIS — L814 Other melanin hyperpigmentation: Secondary | ICD-10-CM

## 2023-01-23 DIAGNOSIS — D1801 Hemangioma of skin and subcutaneous tissue: Secondary | ICD-10-CM

## 2023-01-23 DIAGNOSIS — L821 Other seborrheic keratosis: Secondary | ICD-10-CM

## 2023-01-23 DIAGNOSIS — Z5111 Encounter for antineoplastic chemotherapy: Secondary | ICD-10-CM

## 2023-01-23 NOTE — Progress Notes (Signed)
Follow-Up Visit   Subjective  Kristine Arroyo is a 47 y.o. female who presents for the following: Skin Cancer Screening and Full Body Skin Exam  The patient presents for Total-Body Skin Exam (TBSE) for skin cancer screening and mole check. The patient has spots, moles and lesions to be evaluated, some may be new or changing and the patient may have concern these could be cancer.  Patient with hx of AK's. She does have a few scaly spots at face.   The following portions of the chart were reviewed this encounter and updated as appropriate: medications, allergies, medical history  Review of Systems:  No other skin or systemic complaints except as noted in HPI or Assessment and Plan.  Objective  Well appearing patient in no apparent distress; mood and affect are within normal limits.  A full examination was performed including scalp, head, eyes, ears, nose, lips, neck, chest, axillae, abdomen, back, buttocks, bilateral upper extremities, bilateral lower extremities, hands, feet, fingers, toes, fingernails, and toenails. All findings within normal limits unless otherwise noted below.   Relevant physical exam findings are noted in the Assessment and Plan.  Right Temple/Sideburn (2) Erythematous stuck-on, waxy papule or plaque    Assessment & Plan   SKIN CANCER SCREENING PERFORMED TODAY.  ACTINIC DAMAGE WITH PRECANCEROUS ACTINIC KERATOSES Counseling for Topical Chemotherapy Management: Patient exhibits: - Severe, confluent actinic changes with pre-cancerous actinic keratoses that is secondary to cumulative UV radiation exposure over time - Condition that is severe; chronic, not at goal. - diffuse scaly erythematous macules and papules with underlying dyspigmentation - Discussed Prescription "Field Treatment" topical Chemotherapy for Severe, Chronic Confluent Actinic Changes with Pre-Cancerous Actinic Keratoses Field treatment involves treatment of an entire area of skin that has  confluent Actinic Changes (Sun/ Ultraviolet light damage) and PreCancerous Actinic Keratoses by method of PhotoDynamic Therapy (PDT) and/or prescription Topical Chemotherapy agents such as 5-fluorouracil, 5-fluorouracil/calcipotriene, and/or imiquimod.  The purpose is to decrease the number of clinically evident and subclinical PreCancerous lesions to prevent progression to development of skin cancer by chemically destroying early precancer changes that may or may not be visible.  It has been shown to reduce the risk of developing skin cancer in the treated area. As a result of treatment, redness, scaling, crusting, and open sores may occur during treatment course. One or more than one of these methods may be used and may have to be used several times to control, suppress and eliminate the PreCancerous changes. Discussed treatment course, expected reaction, and possible side effects. - Recommend daily broad spectrum sunscreen SPF 30+ to sun-exposed areas, reapply every 2 hours as needed.  - Staying in the shade or wearing long sleeves, sun glasses (UVA+UVB protection) and wide brim hats (4-inch brim around the entire circumference of the hat) are also recommended. - Call for new or changing lesions. - Start 5-fluorouracil/calcipotriene cream twice a day for 7 days to affected areas including temples. Prescription sent to [facility]. Patient advised they will receive an email to purchase the medication online and have it sent to their home. Patient provided with handout reviewing treatment course and side effects and advised to call or message [place] on MyChart with any concerns.  Reviewed course of treatment and expected reaction.  Patient advised to expect inflammation and crusting and advised that erosions are possible.  Patient advised to be diligent with sun protection during and after treatment. Counseled to keep medication out of reach of children and pets.   LENTIGINES,  SEBORRHEIC KERATOSES, HEMANGIOMAS - Benign normal skin lesions - Benign-appearing - Call for any changes - Bx proven hemangioma at left anterior ankle      MELANOCYTIC NEVI - Tan-brown and/or pink-flesh-colored symmetric macules and papules - Benign appearing on exam today - Observation - Call clinic for new or changing moles - Recommend daily use of broad spectrum spf 30+ sunscreen to sun-exposed areas.   HISTORY OF PRECANCEROUS ACTINIC KERATOSIS - site(s) of PreCancerous Actinic Keratosis clear today. - these may recur and new lesions may form requiring treatment to prevent transformation into skin cancer - observe for new or changing spots and contact [facility] for appointment if occur - photoprotection with sun protective clothing; sunglasses and broad spectrum sunscreen with SPF of at least 30 + and frequent self skin exams recommended - yearly exams by a dermatologist recommended for persons with history of PreCancerous Actinic Keratoses   Inflamed seborrheic keratosis (2) Right Temple/Sideburn  Symptomatic, irritating, patient would like treated.  Benign-appearing.  Call clinic for new or changing lesions.    Destruction of lesion - Right Temple/Sideburn (2)  Destruction method: cryotherapy   Informed consent: discussed and consent obtained   Lesion destroyed using liquid nitrogen: Yes   Cryotherapy cycles:  2 Outcome: patient tolerated procedure well with no complications   Post-procedure details: wound care instructions given     Return in about 1 year (around 01/23/2024) for TBSE, with Dr. Kirtland Bouchard, Hx AK.  Anise Salvo, RMA, am acting as scribe for Armida Sans, MD .   Documentation: I have reviewed the above documentation for accuracy and completeness, and I agree with the above.  Armida Sans, MD

## 2023-01-23 NOTE — Patient Instructions (Addendum)
 Cryotherapy Aftercare  Wash gently with soap and water everyday.   Apply Vaseline and Band-Aid daily until healed.   Instructions for [facility]  One or more of your medications was sent to the [facility] order [facility]. You will receive an email from them and can purchase the medicine through that link. It will then be mailed to your home at the address you confirmed. If for any reason you do not receive an email from them, please check your spam folder. If you still do not find the email, please let us know. [facility] phone number is [phone].  Apply 5-fluorouracil/calcipotriene cream twice a day for 7 days to affected areas including temples. Prescription sent to [facility]. Patient advised they will receive an email to purchase the medication online and have it sent to their home. Patient provided with handout reviewing treatment course and side effects and advised to call or message [place] on MyChart with any concerns.  Melanoma ABCDEs  Melanoma is the most dangerous type of skin cancer, and is the leading cause of death from skin disease.  You are more likely to develop melanoma if you: Have light-colored skin, light-colored eyes, or red or blond hair Spend a lot of time in the sun Tan regularly, either outdoors or in a tanning bed Have had blistering sunburns, especially during childhood Have a close family member who has had a melanoma Have atypical moles or large birthmarks  Early detection of melanoma is key since treatment is typically straightforward and cure rates are extremely high if we catch it early.   The first sign of melanoma is often a change in a mole or a new dark spot.  The ABCDE system is a way of remembering the signs of melanoma.  A for asymmetry:  The two halves do not match. B for border:  The edges of the growth are irregular. C for color:  A mixture of colors are present instead of an even  brown color. D for diameter:  Melanomas are usually (but not always) greater than 6mm - the size of a pencil eraser. E for evolution:  The spot keeps changing in size, shape, and color.  Please check your skin once per month between visits. You can use a small mirror in front and a large mirror behind you to keep an eye on the back side or your body.   If you see any new or changing lesions before your next follow-up, please call to schedule a visit.  Please continue daily skin protection including broad spectrum sunscreen SPF 30+ to sun-exposed areas, reapplying every 2 hours as needed when you're outdoors.    Due to recent changes in healthcare laws, you may see results of your pathology and/or laboratory studies on MyChart before the doctors have had a chance to review them. We understand that in some cases there may be results that are confusing or concerning to you. Please understand that not all results are received at the same time and often the doctors may need to interpret multiple results in order to provide you with the best plan of care or course of treatment. Therefore, we ask that you please give [place] 2 business days to thoroughly review all your results before contacting the office for clarification. Should we see a critical lab result, you will be contacted sooner.   If You Need Anything After Your Visit  If you have any questions or concerns for your doctor, please  call our main line at [phone] and press option 4 to reach your doctor's medical assistant. If no one answers, please leave a voicemail as directed and we will return your call as soon as possible. Messages left after 4 pm will be answered the following business day.   You may also send [place] a message via MyChart. We typically respond to MyChart messages within 1-2 business days.  For prescription refills, please ask your pharmacy to contact our office. Our fax number is [phone].  If you have an urgent issue when  the clinic is closed that cannot wait until the next business day, you can page your doctor at the number below.    Please note that while we do our best to be available for urgent issues outside of office hours, we are not available 24/7.   If you have an urgent issue and are unable to reach [place], you may choose to seek medical care at your doctor's office, retail clinic, urgent care center, or emergency room.  If you have a medical emergency, please immediately call 911 or go to the emergency department.  Pager Numbers  - Dr. Gwen Pounds: [phone]  - Dr. Roseanne Reno: [phone]  - Dr. Katrinka Blazing: [phone]   In the event of inclement weather, please call our main line at [phone] for an update on the status of any delays or closures.  Dermatology Medication Tips: Please keep the boxes that topical medications come in in order to help keep track of the instructions about where and how to use these. Pharmacies typically print the medication instructions only on the boxes and not directly on the medication tubes.   If your medication is too expensive, please contact our office at [phone] option 4 or send [place] a message through MyChart.   We are unable to tell what your co-pay for medications will be in advance as this is different depending on your insurance coverage. However, we may be able to find a substitute medication at lower cost or fill out paperwork to get insurance to cover a needed medication.   If a prior authorization is required to get your medication covered by your insurance company, please allow [place] 1-2 business days to complete this process.  Drug prices often vary depending on where the prescription is filled and some pharmacies may offer cheaper prices.  The website www.goodrx.com contains coupons for medications through different pharmacies. The prices here do not account for what the cost may be with help from insurance (it may be cheaper with your insurance), but  the website can give you the price if you did not use any insurance.  - You can print the associated coupon and take it with your prescription to the pharmacy.  - You may also stop by our office during regular business hours and pick up a GoodRx coupon card.  - If you need your prescription sent electronically to a different pharmacy, notify our office through [facility] or by phone at [phone] option 4.

## 2023-01-28 ENCOUNTER — Encounter: Payer: Self-pay | Admitting: Dermatology

## 2023-04-07 ENCOUNTER — Encounter: Payer: Self-pay | Admitting: Advanced Practice Midwife

## 2023-04-07 ENCOUNTER — Ambulatory Visit (INDEPENDENT_AMBULATORY_CARE_PROVIDER_SITE_OTHER): Payer: 59 | Admitting: Advanced Practice Midwife

## 2023-04-07 ENCOUNTER — Other Ambulatory Visit (HOSPITAL_COMMUNITY)
Admission: RE | Admit: 2023-04-07 | Discharge: 2023-04-07 | Disposition: A | Discharge status: Home / Self Care | Payer: 59 | Source: Ambulatory Visit | Attending: Advanced Practice Midwife | Admitting: Advanced Practice Midwife

## 2023-04-07 VITALS — BP 122/79 | HR 61 | Ht 66.0 in | Wt 137.3 lb

## 2023-04-07 DIAGNOSIS — Z131 Encounter for screening for diabetes mellitus: Secondary | ICD-10-CM

## 2023-04-07 DIAGNOSIS — Z124 Encounter for screening for malignant neoplasm of cervix: Secondary | ICD-10-CM | POA: Insufficient documentation

## 2023-04-07 DIAGNOSIS — Z1239 Encounter for other screening for malignant neoplasm of breast: Secondary | ICD-10-CM

## 2023-04-07 DIAGNOSIS — Z01419 Encounter for gynecological examination (general) (routine) without abnormal findings: Secondary | ICD-10-CM | POA: Insufficient documentation

## 2023-04-07 DIAGNOSIS — Z13220 Encounter for screening for lipoid disorders: Secondary | ICD-10-CM

## 2023-04-07 DIAGNOSIS — Z1321 Encounter for screening for nutritional disorder: Secondary | ICD-10-CM

## 2023-04-07 NOTE — Progress Notes (Signed)
Gynecology Annual Exam  PCP: Conard Novak, MD  Chief Complaint:  Chief Complaint  Patient presents with   Annual Exam    History of Present Illness: Patient is a 48 y.o. [id number] presents for annual exam. The patient has no gyn complaints today. She has hot flashes nightly and some daytime as well. Continues to use [employer] supplements with good results.  LMP: Patient's last menstrual period was 04/01/2023 (exact date). Average Interval: irregular,  22-49  days Duration of flow: 4 days Heavy Menses: no Clots: no Intermenstrual Bleeding: no Postcoital Bleeding: no Dysmenorrhea: no   The patient is sexually active. She currently uses tubal ligation for contraception. She denies dyspareunia. Uses lube as needed.  The patient does perform self breast exams.  There is no notable family history of breast or ovarian cancer in her family.  The patient wears seatbelts: yes.   The patient has regular exercise:  she has regular physical activity, she eats a healthy diet, adequate hydration, sleep is interrupted .    The patient denies current symptoms of depression.    Review of Systems: Review of Systems  Constitutional:  Positive for malaise/fatigue. Negative for chills and fever.  HENT:  Negative for congestion, ear discharge, ear pain, hearing loss, sinus pain and sore throat.   Eyes:  Negative for blurred vision and double vision.  Respiratory:  Negative for cough, shortness of breath and wheezing.   Cardiovascular:  Negative for chest pain, palpitations and leg swelling.  Gastrointestinal:  Negative for abdominal pain, blood in stool, constipation, diarrhea, heartburn, melena, nausea and vomiting.  Genitourinary:  Negative for dysuria, flank pain, frequency, hematuria and urgency.  Musculoskeletal:  Positive for joint pain. Negative for back pain and myalgias.  Skin:  Negative for itching and rash.  Neurological:  Negative for dizziness, tingling, tremors, sensory change,  speech change, focal weakness, seizures, loss of consciousness, weakness and headaches.  Endo/Heme/Allergies:  Negative for environmental allergies. Does not bruise/bleed easily.       Positive for hot flash  Psychiatric/Behavioral:  Negative for depression, hallucinations, memory loss, substance abuse and suicidal ideas. The patient is not nervous/anxious and does not have insomnia.     Past Medical History:  Patient Active Problem List   Diagnosis Date Noted Date Diagnosed   Admission for sterilization     History of cesarean delivery 11/16/2020    Indication for care in labor and delivery, antepartum 10/27/2020    Supervision of high risk pregnancy, antepartum 02/18/2019     Clinic [facility] Prenatal Labs  Dating LMPm confirmed by 7 wk [place] Blood type: O/Positive/-- (03/07 0851)   Genetic Screen    NIPS: nml XY Antibody:Negative (03/07 0851)  Anatomic [place] complete Rubella: 4.02 (03/07 0851)  Varicella: Immune  GTT Third trimester: 84 RPR: Non Reactive (03/07 0851)   Rhogam n/a HBsAg: Negative (03/07 0851)   TDaP vaccine   09/27/2020                Flu Shot: HIV: Non Reactive (03/07 0851)   Baby Food  Breast                              GBS: positive  Contraception  BTL Pap:05/01/20  CBB  no   CS/VBAC  R CS desired   Support Person husb         History of cesarean delivery affecting pregnancy 02/18/2019      -  due to fetal intolerance of labor - desires repeat c-section - 05/29/20 OB/GYN  Counseling Note 48 y.o. [id number] at [redacted]w[redacted]d with Estimated Date of Delivery: 12/03/20 was seen today in office to discuss trial of labor after cesarean section (TOLAC) versus elective repeat cesarean delivery (ERCD). The following risks were discussed with the patient.  Risk of uterine rupture at term is 0.78 percent with TOLAC and 0.22 percent with ERCD. 1 in 10 uterine ruptures will result in neonatal death or neurological injury. The benefits of a trial of labor after cesarean (TOLAC) resulting in a  vaginal birth after cesarean (VBAC) include the following: shorter length of hospital stay and postpartum recovery (in most cases); fewer complications, such as postpartum fever, wound or uterine infection, thromboembolism (blood clots in the leg or lung), need for blood transfusion and fewer neonatal breathing problems.  The risks of an attempted VBAC or TOLAC include the following: Risk of failed trial of labor after cesarean (TOLAC) without a vaginal birth after cesarean (VBAC) resulting in repeat cesarean delivery (RCD) in about 20 to 40 percent of women who attempt VBAC.  Risk of rupture of uterus resulting in an emergency cesarean delivery. The risk of uterine rupture may be related in part to the type of uterine incision made during the first cesarean delivery. A previous transverse uterine incision has the lowest risk of rupture (0.2 to 1.5 percent risk). Vertical or T-shaped uterine incisions have a higher risk of uterine rupture (4 to 9 percent risk)The risk of fetal death is very low with both VBAC and elective repeat cesarean delivery (ERCD), but the likelihood of fetal death is higher with VBAC than with ERCD. Maternal death is very rare with either type of delivery.  The risks of an elective repeat cesarean delivery (ERCD) were reviewed with the patient including but not limited to: 04/998 risk of uterine rupture which could have serious consequences, bleeding which may require transfusion; infection which may require antibiotics; injury to bowel, bladder or other surrounding organs (bowel, bladder, ureters); injury to the fetus; need for additional procedures including hysterectomy in the event of a life-threatening hemorrhage; thromboembolic phenomenon; abnormal placentation; incisional problems; death and other postoperative or anesthesia complications.        Gestational hypertension affecting sixth pregnancy 02/18/2019    Advanced maternal age in multigravida 02/18/2019     NIPT  nml MFM [place] for anatomy check [ ]     Lumbago 12/17/2016    Celiac disease 10/22/2016     Past Surgical History:  Past Surgical History:  Procedure Laterality Date   CESAREAN SECTION N/A 05/03/2017   Procedure: CESAREAN SECTION;  Surgeon: Nadara Mustard, MD;  Location: [facility] ORS;  Service: Obstetrics;  Laterality: N/A;   CESAREAN SECTION WITH BILATERAL TUBAL LIGATION Bilateral 11/16/2020   Procedure: CESAREAN SECTION WITH BILATERAL TUBAL LIGATION;  Surgeon: Nadara Mustard, MD;  Location: [facility] ORS;  Service: Obstetrics;  Laterality: Bilateral;   HERNIA REPAIR Right 2011   inguinal   REFRACTIVE SURGERY  2011    Gynecologic History:  Patient's last menstrual period was 04/01/2023 (exact date).  Last Pap: 3 years ago Results were:  no abnormalities  Last mammogram: 2018 Results were: Bi-Rads 1 (extremely dense)  Obstetric History: [id number]  Family History:  Family History  Problem Relation Age of Onset   Diabetes Mellitus II Maternal Grandfather    Throat cancer Maternal Grandfather    Pancreatic cancer Paternal Grandmother    Diabetes Mellitus II Maternal Grandmother  Social History:  Social History   Socioeconomic History   Marital status: Married    Spouse name: Jill Alexanders   Number of children: 1   Years of education: 16   Highest education level: Not on file  Occupational History   Occupation: Engineer, site  Tobacco Use   Smoking status: Former    Types: Cigarettes   Smokeless tobacco: Never   Tobacco comments:    stopped 2003  Vaping Use   Vaping status: Never Used  Substance and Sexual Activity   Alcohol use: Not Currently    Alcohol/week: 1.0 standard drink of alcohol    Types: 1 Glasses of wine per week   Drug use: No   Sexual activity: Yes    Birth control/protection: None  Other Topics Concern   Not on file  Social History Narrative   Not on file   Social Drivers of Health   Financial Resource Strain: Not on file  Food Insecurity: Not  on file  Transportation Needs: Not on file  Physical Activity: Unknown (08/12/2018)   Exercise Vital Sign    Days of Exercise per Week: 0 days    Minutes of Exercise per Session: Not on file  Stress: Not on file  Social Connections: Not on file  Intimate Partner Violence: Not on file    Allergies:  Allergies  Allergen Reactions   Gluten Meal Nausea And Vomiting    Compares to food poisoning - celiac disease    Medications: Prior to Admission medications   Medication Sig Start Date End Date Taking? Authorizing Provider  hydrocortisone cream 1 % Apply 1 application  topically 2 (two) times daily as needed for itching.   Yes [provider]    Physical Exam Vitals: Blood pressure 122/79, pulse 61, height 5\' 6"  (1.676 m), weight 137 lb 4.8 oz (62.3 kg), last menstrual period 04/01/2023, not currently breastfeeding.  General: NAD HEENT: normocephalic, anicteric Thyroid: no enlargement, no palpable nodules Pulmonary: No increased work of breathing, CTAB Cardiovascular: RRR, distal pulses 2+ Breast: Breast symmetrical, no tenderness, no palpable nodules or masses, no skin or nipple retraction present, no nipple discharge.  No axillary or supraclavicular lymphadenopathy. Abdomen: NABS, soft, non-tender, non-distended.  Umbilicus without lesions.  No hepatomegaly, splenomegaly or masses palpable. No evidence of hernia  Genitourinary:  External: Normal external female genitalia.  Normal urethral meatus, normal Bartholin's and Skene's glands.    Vagina: Normal vaginal mucosa, no evidence of prolapse.    Cervix: Grossly normal in appearance, no bleeding  Uterus: Non-enlarged, mobile, normal contour.  No CMT  Adnexa: ovaries non-enlarged, no adnexal masses  Rectal: deferred  Lymphatic: no evidence of inguinal lymphadenopathy Extremities: no edema, erythema, or tenderness Neurologic: Grossly intact Psychiatric: mood appropriate, affect full   Assessment: 48 y.o. [id number]  routine annual exam  Plan: Problem List Items Addressed This Visit   None Visit Diagnoses       Well woman exam with routine gynecological exam    -  Primary   Relevant Orders   Cytology - PAP   VITAMIN D 25 Hydroxy (Vit-D Deficiency, Fractures)   Hgb A1c w/o eAG   Lipid Panel With LDL/HDL Ratio   Comprehensive metabolic panel   CBC   MM 3D SCREENING MAMMOGRAM BILATERAL BREAST     Screening for cervical cancer       Relevant Orders   Cytology - PAP     Encounter for vitamin deficiency screening       Relevant Orders  VITAMIN D 25 Hydroxy (Vit-D Deficiency, Fractures)     Screening for diabetes mellitus       Relevant Orders   Hgb A1c w/o eAG     Screening, lipid       Relevant Orders   Lipid Panel With LDL/HDL Ratio     Breast screening       Relevant Orders   MM 3D SCREENING MAMMOGRAM BILATERAL BREAST       1) Mammogram - recommend yearly screening mammogram.  Mammogram Was ordered today  2) STI screening  was offered and declined  3) ASCCP guidelines and rationale discussed.  Patient opts for every 3 years screening interval  4) Contraception - the patient is currently using  tubal ligation.  She is happy with her current form of contraception and plans to continue  5) Colonoscopy -- Screening recommended starting at age 59 for average risk individuals, age 59 for individuals deemed at increased risk (including African Americans) and recommended to continue until age 36.  For patient age 45-85 individualized approach is recommended.  Gold standard screening is via colonoscopy, Cologuard screening is an acceptable alternative for patient unwilling or unable to undergo colonoscopy.  "Colorectal cancer screening for average?risk adults: 2018 guideline update from the American Cancer Society"[place]: A Cancer Journal for Clinicians: Jul 31, 2016   6) Routine healthcare maintenance including cholesterol, diabetes screening discussed Ordered today  7) Return in about 1 year  (around 04/06/2024) for annual established gyn.   Tresea Mall, CNM [facility] 04/07/2023 12:04 PM

## 2023-04-08 LAB — LIPID PANEL WITH LDL/HDL RATIO
Cholesterol, Total: 175 mg/dL (ref 100–199)
HDL: 65 mg/dL (ref >39)
LDL Chol Calc (NIH): 101 mg/dL — ABNORMAL HIGH (ref 0–99)
LDL/HDL Ratio: 1.6 {ratio} (ref 0.0–3.2)
Triglycerides: 41 mg/dL (ref 0–149)
VLDL Cholesterol Cal: 9 mg/dL (ref 5–40)

## 2023-04-08 LAB — CBC
Hematocrit: 39.5 % (ref 34.0–46.6)
Hemoglobin: 12.9 g/dL (ref 11.1–15.9)
MCH: 30 pg (ref 26.6–33.0)
MCHC: 32.7 g/dL (ref 31.5–35.7)
MCV: 92 fL (ref 79–97)
Platelets: 193 10*3/uL (ref 150–450)
RBC: 4.3 x10E6/uL (ref 3.77–5.28)
RDW: 12.8 % (ref 11.7–15.4)
WBC: 4.9 10*3/uL (ref 3.4–10.8)

## 2023-04-08 LAB — COMPREHENSIVE METABOLIC PANEL
ALT: 12 [IU]/L (ref 0–32)
AST: 14 [IU]/L (ref 0–40)
Albumin: 4.5 g/dL (ref 3.9–4.9)
Alkaline Phosphatase: 54 [IU]/L (ref 44–121)
BUN/Creatinine Ratio: 14 (ref 9–23)
BUN: 10 mg/dL (ref 6–24)
Bilirubin Total: 0.4 mg/dL (ref 0.0–1.2)
CO2: 24 mmol/L (ref 20–29)
Calcium: 9 mg/dL (ref 8.7–10.2)
Chloride: 104 mmol/L (ref 96–106)
Creatinine, Ser: 0.7 mg/dL (ref 0.57–1.00)
Globulin, Total: 2.3 g/dL (ref 1.5–4.5)
Glucose: 90 mg/dL (ref 70–99)
Potassium: 4.7 mmol/L (ref 3.5–5.2)
Sodium: 139 mmol/L (ref 134–144)
Total Protein: 6.8 g/dL (ref 6.0–8.5)
eGFR: 107 mL/min/{1.73_m2} (ref >59)

## 2023-04-08 LAB — VITAMIN D 25 HYDROXY (VIT D DEFICIENCY, FRACTURES): Vit D, 25-Hydroxy: 27.7 ng/mL — ABNORMAL LOW (ref 30.0–100.0)

## 2023-04-08 LAB — HGB A1C W/O EAG: Hgb A1c MFr Bld: 5.4 % (ref 4.8–5.6)

## 2023-04-10 LAB — CYTOLOGY - PAP
Comment: NEGATIVE
Diagnosis: UNDETERMINED — AB
High risk HPV: NEGATIVE

## 2023-04-27 ENCOUNTER — Encounter: Payer: Self-pay | Admitting: Advanced Practice Midwife

## 2023-10-23 ENCOUNTER — Ambulatory Visit
Admission: RE | Admit: 2023-10-23 | Discharge: 2023-10-23 | Disposition: A | Discharge status: Home / Self Care | Source: Ambulatory Visit | Attending: Advanced Practice Midwife | Admitting: Advanced Practice Midwife

## 2023-10-23 ENCOUNTER — Inpatient Hospital Stay
Admission: RE | Admit: 2023-10-23 | Discharge: 2023-10-23 | Disposition: A | Discharge status: Home / Self Care | Payer: Self-pay | Source: Ambulatory Visit | Attending: Obstetrics and Gynecology | Admitting: Obstetrics and Gynecology

## 2023-10-23 ENCOUNTER — Other Ambulatory Visit: Payer: Self-pay | Admitting: *Deleted

## 2023-10-23 DIAGNOSIS — Z1231 Encounter for screening mammogram for malignant neoplasm of breast: Secondary | ICD-10-CM

## 2023-10-23 DIAGNOSIS — R928 Other abnormal and inconclusive findings on diagnostic imaging of breast: Secondary | ICD-10-CM | POA: Insufficient documentation

## 2023-10-23 DIAGNOSIS — Z01419 Encounter for gynecological examination (general) (routine) without abnormal findings: Secondary | ICD-10-CM

## 2023-10-23 DIAGNOSIS — Z1239 Encounter for other screening for malignant neoplasm of breast: Secondary | ICD-10-CM

## 2023-10-27 ENCOUNTER — Other Ambulatory Visit: Payer: Self-pay | Admitting: Advanced Practice Midwife

## 2023-10-27 DIAGNOSIS — R928 Other abnormal and inconclusive findings on diagnostic imaging of breast: Secondary | ICD-10-CM

## 2023-10-28 ENCOUNTER — Encounter: Payer: Self-pay | Admitting: Advanced Practice Midwife

## 2023-10-30 ENCOUNTER — Ambulatory Visit
Admission: RE | Admit: 2023-10-30 | Discharge: 2023-10-30 | Disposition: A | Discharge status: Home / Self Care | Source: Ambulatory Visit | Attending: Advanced Practice Midwife | Admitting: Advanced Practice Midwife

## 2023-10-30 ENCOUNTER — Inpatient Hospital Stay
Admission: RE | Admit: 2023-10-30 | Discharge: 2023-10-30 | Discharge status: Home / Self Care | Source: Ambulatory Visit | Attending: Advanced Practice Midwife | Admitting: Advanced Practice Midwife

## 2023-10-30 DIAGNOSIS — R928 Other abnormal and inconclusive findings on diagnostic imaging of breast: Secondary | ICD-10-CM | POA: Diagnosis present

## 2023-11-11 ENCOUNTER — Encounter: Payer: Self-pay | Admitting: Advanced Practice Midwife

## 2024-02-04 ENCOUNTER — Ambulatory Visit: Payer: 59 | Admitting: Dermatology

## 2024-02-04 ENCOUNTER — Encounter: Payer: Self-pay | Admitting: Dermatology

## 2024-02-04 DIAGNOSIS — D229 Melanocytic nevi, unspecified: Secondary | ICD-10-CM

## 2024-02-04 DIAGNOSIS — Z1283 Encounter for screening for malignant neoplasm of skin: Secondary | ICD-10-CM

## 2024-02-04 DIAGNOSIS — L814 Other melanin hyperpigmentation: Secondary | ICD-10-CM

## 2024-02-04 DIAGNOSIS — L821 Other seborrheic keratosis: Secondary | ICD-10-CM

## 2024-02-04 DIAGNOSIS — D239 Other benign neoplasm of skin, unspecified: Secondary | ICD-10-CM

## 2024-02-04 DIAGNOSIS — L578 Other skin changes due to chronic exposure to nonionizing radiation: Secondary | ICD-10-CM | POA: Diagnosis not present

## 2024-02-04 DIAGNOSIS — D2371 Other benign neoplasm of skin of right lower limb, including hip: Secondary | ICD-10-CM

## 2024-02-04 DIAGNOSIS — D1801 Hemangioma of skin and subcutaneous tissue: Secondary | ICD-10-CM

## 2024-02-04 DIAGNOSIS — W908XXA Exposure to other nonionizing radiation, initial encounter: Secondary | ICD-10-CM | POA: Diagnosis not present

## 2024-02-04 NOTE — Patient Instructions (Signed)

## 2024-02-04 NOTE — Progress Notes (Signed)
   Follow-Up Visit   Subjective  Kristine Arroyo is a 48 y.o. female who presents for the following: Skin Cancer Screening and Full Body Skin Exam, hx of precancers, treated her face with 5FU/Calcipotriene cream with a good response.  The patient presents for Total-Body Skin Exam (TBSE) for skin cancer screening and mole check. The patient has spots, moles and lesions to be evaluated, some may be new or changing and the patient may have concern these could be cancer.  The following portions of the chart were reviewed this encounter and updated as appropriate: medications, allergies, medical history  Review of Systems:  No other skin or systemic complaints except as noted in HPI or Assessment and Plan.  Objective  Well appearing patient in no apparent distress; mood and affect are within normal limits.  A full examination was performed including scalp, head, eyes, ears, nose, lips, neck, chest, axillae, abdomen, back, buttocks, bilateral upper extremities, bilateral lower extremities, hands, feet, fingers, toes, fingernails, and toenails. All findings within normal limits unless otherwise noted below.   Relevant physical exam findings are noted in the Assessment and Plan.   Assessment & Plan   SKIN CANCER SCREENING PERFORMED TODAY.  ACTINIC DAMAGE - Chronic condition, secondary to cumulative UV/sun exposure - diffuse scaly erythematous macules with underlying dyspigmentation - Recommend daily broad spectrum sunscreen SPF 30+ to sun-exposed areas, reapply every 2 hours as needed.  - Staying in the shade or wearing long sleeves, sun glasses (UVA+UVB protection) and wide brim hats (4-inch brim around the entire circumference of the hat) are also recommended for sun protection.  - Call for new or changing lesions.  LENTIGINES, SEBORRHEIC KERATOSES, HEMANGIOMAS - Benign normal skin lesions - Benign-appearing - Call for any changes  MELANOCYTIC NEVI - Tan-brown and/or pink-flesh-colored  symmetric macules and papules - Benign appearing on exam today - Observation - Call clinic for new or changing moles - Recommend daily use of broad spectrum spf 30+ sunscreen to sun-exposed areas.    DERMATOFIBROMA Exam: Firm pink/brown papulenodule with dimple sign on the right lower leg below the knee. Treatment Plan: A dermatofibroma is a benign growth possibly related to trauma, such as an insect bite, cut from shaving, or inflamed acne-type bump.  Treatment options to remove include shave or excision with resulting scar and risk of recurrence.  Since benign-appearing and not bothersome, will observe for now.    Hemangioma of skin L ant ankle Bx proven, benign, observe   Return in about 1 year (around 02/03/2025) for TBSE, hx of AKs .  IFay Kirks, CMA, am acting as scribe for Alm Rhyme, MD .   Documentation: I have reviewed the above documentation for accuracy and completeness, and I agree with the above.  Alm Rhyme, MD

## 2024-04-02 ENCOUNTER — Other Ambulatory Visit: Payer: Self-pay | Admitting: Advanced Practice Midwife

## 2024-04-02 DIAGNOSIS — R928 Other abnormal and inconclusive findings on diagnostic imaging of breast: Secondary | ICD-10-CM

## 2025-02-03 ENCOUNTER — Ambulatory Visit: Admitting: Dermatology
